# Patient Record
Sex: Female | Born: 1995 | Race: White | Hispanic: No | Marital: Single | Smoking: Current every day smoker
Health system: Southern US, Community
[De-identification: ages and names within clinical notes are randomized; demographics above are authoritative.]

## PROBLEM LIST (undated history)

## (undated) DIAGNOSIS — R87619 Unspecified abnormal cytological findings in specimens from cervix uteri: Secondary | ICD-10-CM

## (undated) DIAGNOSIS — A64 Unspecified sexually transmitted disease: Secondary | ICD-10-CM

## (undated) HISTORY — DX: Unspecified sexually transmitted disease: A64

## (undated) HISTORY — DX: Unspecified abnormal cytological findings in specimens from cervix uteri: R87.619

## (undated) HISTORY — PX: INTRAUTERINE DEVICE INSERTION: SHX323

---
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
## 2019-04-06 DIAGNOSIS — Z1159 Encounter for screening for other viral diseases: Secondary | ICD-10-CM | POA: Diagnosis not present

## 2019-05-14 DIAGNOSIS — Z20828 Contact with and (suspected) exposure to other viral communicable diseases: Secondary | ICD-10-CM | POA: Diagnosis not present

## 2019-07-04 DIAGNOSIS — B373 Candidiasis of vulva and vagina: Secondary | ICD-10-CM | POA: Diagnosis not present

## 2019-08-31 DIAGNOSIS — Z113 Encounter for screening for infections with a predominantly sexual mode of transmission: Secondary | ICD-10-CM | POA: Diagnosis not present

## 2019-08-31 DIAGNOSIS — Z30431 Encounter for routine checking of intrauterine contraceptive device: Secondary | ICD-10-CM | POA: Diagnosis not present

## 2019-08-31 DIAGNOSIS — Z01419 Encounter for gynecological examination (general) (routine) without abnormal findings: Secondary | ICD-10-CM | POA: Diagnosis not present

## 2020-02-07 ENCOUNTER — Encounter (INDEPENDENT_AMBULATORY_CARE_PROVIDER_SITE_OTHER): Payer: Self-pay

## 2020-02-07 ENCOUNTER — Encounter: Payer: Self-pay | Admitting: Adult Health Nurse Practitioner

## 2020-02-07 ENCOUNTER — Other Ambulatory Visit: Payer: Self-pay

## 2020-02-07 ENCOUNTER — Ambulatory Visit: Payer: BC Managed Care – PPO | Admitting: Adult Health Nurse Practitioner

## 2020-02-07 ENCOUNTER — Encounter: Payer: Self-pay | Admitting: Adult Health

## 2020-02-07 VITALS — BP 118/70 | HR 95 | Temp 96.8°F | Ht 66.75 in | Wt 212.4 lb

## 2020-02-07 DIAGNOSIS — Z6833 Body mass index (BMI) 33.0-33.9, adult: Secondary | ICD-10-CM

## 2020-02-07 DIAGNOSIS — E559 Vitamin D deficiency, unspecified: Secondary | ICD-10-CM | POA: Diagnosis not present

## 2020-02-07 DIAGNOSIS — Z7689 Persons encountering health services in other specified circumstances: Secondary | ICD-10-CM

## 2020-02-07 DIAGNOSIS — R41840 Attention and concentration deficit: Secondary | ICD-10-CM | POA: Diagnosis not present

## 2020-02-07 DIAGNOSIS — Z8619 Personal history of other infectious and parasitic diseases: Secondary | ICD-10-CM

## 2020-02-07 DIAGNOSIS — Z975 Presence of (intrauterine) contraceptive device: Secondary | ICD-10-CM

## 2020-02-07 NOTE — Progress Notes (Signed)
Establish Care  Assessment and Plan:  Diagnoses and all orders for this visit:  Encounter to establish care  IUD (intrauterine device) in place Discussed establishing with OB/GYN for on going care related to age  History of HPV infection Notable for future screenings  Attention and concentration deficit Discussed methods of management Will do ADD/ADHD questionnaire next OV  Vitamin D deficiency Continue supplementation to maintain goal of 60-100 Not taking any supplementation at this time  BMI 33.0-33.9 Discussed dietary and exercise modifications  Discussed med's effects and SE's. Screening labs and tests as requested with regular follow-up as recommended. Over 30 minutes of face to face interview exam, counseling, chart review, and complex, high level critical decision making was performed this visit.    HPI  24 y.o. female  presents to establish care.  She recently moved back here one year after finishing college.  She was being seen for her womens health in [place], [place].  She has paraguard IUD, placed in 2017.  And her last pap was 09/2019.  She reports HPV positive on previous pap but has since cleared.  She has not had a primary care  But utilized the school [facility] or Urgent care for any acute needs.  She reports she does take elderberry for immune support and a vitamin C supplement.  Reports she is not consistent with them.  Reports she knows she does not drink enough water.  She reports her job does not allow drinking while working and on breaks isn't enough.    Report she has though that she possibly has ADD.  She reports that she has trouble completing tasks.  She has to have people repeat things or misses part of instructions.  She has noticee lot of impatience.  Consider referral to psychology, she has not had this in the past.  Will request records from previous office,  release of records form provided.  Her blood pressure has been controlled at home, today  their BP is   She does not workout. She denies chest pain, shortness of breath, dizziness.   She is not on cholesterol medication and denies myalgias. Her cholesterol has not been checked related to her age.  She has not been working on diet and exercise for prevention of prediabetes, she is not on bASA, she is not on ACE/ARB and denies nausea, paresthesia of the feet, polydipsia, polyuria, visual disturbances, vomiting and weight loss. No previous A1c on file.  Current Medications:  No current outpatient medications on file prior to visit.   No current facility-administered medications on file prior to visit.    Allergies:  Not on File  Medical History:  She does not have a problem list on file.   Names of Other Physician/Practitioners you currently use: 1. [facility] here for primary care 2. Eye Exam:  Due for 2021 3. Dental Exam, every 6 months. Patient Care Team: Christy Cowboy, MD as PCP - General (Internal Medicine)  Health Maintenance:    There is no immunization history on file for this patient.   Preventative care: Last colonoscopy: N/A Last mammogram: N/A Last pap smear/pelvic exam: 2019, recommend establishing women's health related to ongoing birth control management. DEXA:N/A   Vaccinations:   Will check records for all vaccination.  Likely up to date related to college attendance, post graduate 2 years. TD or Tdap: ?  Influenza: Due for 2021  Pneumococcal: N/A Prevnar13: N/A Shingles/Zostavax: N/A HPV: Complete   Surgical History:  She  has no past surgical history on file.  Family History:  Her family history is not on file.  Social History:  She is single and just moved back to [place].  She does not drink ETOH and denies illicit drug use.    Review of Systems: Review of Systems  Constitutional: Negative for chills, diaphoresis, fever, malaise/fatigue and weight loss.  HENT: Negative for congestion,  ear discharge, ear pain, hearing loss, nosebleeds, sinus pain, sore throat and tinnitus.   Eyes: Negative for blurred vision, double vision, photophobia, pain, discharge and redness.  Respiratory: Negative for cough, hemoptysis, sputum production, shortness of breath, wheezing and stridor.   Cardiovascular: Negative for chest pain, palpitations, orthopnea, claudication, leg swelling and PND.  Gastrointestinal: Negative for abdominal pain, blood in stool, constipation, diarrhea, heartburn, melena, nausea and vomiting.  Genitourinary: Negative for dysuria, flank pain, frequency, hematuria and urgency.  Musculoskeletal: Negative for back pain, falls, joint pain, myalgias and neck pain.  Skin: Negative for itching and rash.  Neurological: Negative for dizziness, tingling, tremors, sensory change, speech change, focal weakness, seizures, loss of consciousness, weakness and headaches.  Endo/Heme/Allergies: Negative for environmental allergies and polydipsia. Does not bruise/bleed easily.  Psychiatric/Behavioral: Negative for depression, hallucinations, memory loss, substance abuse and suicidal ideas. The patient is not nervous/anxious and does not have insomnia.      Physical Exam: Vitals:   02/07/20 1504  BP: 118/70  Pulse: 95  Temp: (!) 96.8 F (36 C)  Height: 5' 6.75" (1.695 m)  Weight: 212 lb 6.4 oz (96.3 kg)  SpO2: 98%  BMI (Calculated): 33.53    General Appearance: Well nourished, in no apparent distress.  Eyes: PERRLA, EOMs, conjunctiva no swelling or erythema, normal fundi and vessels. Respiratory: Respiratory effort normal, BS equal bilaterally without rales, rhonchi, wheezing or stridor.  Cardio: RRR without murmurs, rubs or gallops. Brisk peripheral pulses without edema.   Abdomen: Soft, nontender, no guarding, rebound, hernias, masses, or organomegaly.  Lymphatics: Non tender without lymphadenopathy.  Musculoskeletal: Full ROM all peripheral extremities,5/5 strength, and normal  gait.  Skin: Warm, dry without rashes, lesions, ecchymosis. Neuro: Cranial nerves intact, reflexes equal bilaterally. Normal muscle tone, no cerebellar symptoms. Sensation intact.  Psych: Awake and oriented X 3, normal affect, Insight and Judgment appropriate.     Garnet Sierras, Laqueta Jean, DNP [facility] 02/14/2020  4:58 PM

## 2020-02-24 ENCOUNTER — Other Ambulatory Visit: Payer: Self-pay | Admitting: Adult Health Nurse Practitioner

## 2020-02-24 DIAGNOSIS — E782 Mixed hyperlipidemia: Secondary | ICD-10-CM

## 2020-03-10 NOTE — Progress Notes (Signed)
Assessment and Plan:  Lakelyn was seen today for follow-up.  Diagnoses and all orders for this visit:  Attention deficit hyperactivity disorder (ADHD), unspecified ADHD type Depression, unspecified depression type She does report significant 5/6 on ASRS today, however also PHQ-9 of 9 Possible ADD like sx secondary to depression; may benefit from wellbutrin Will also recommend lifestyle changes; encouraged more daily activity - good sleep hygiene discussed, increase day time activity, try melatonin or benadryl if this does not help we will call in sleep medication.  She is agreeable to 2-3 month trial; if inadequate benefit consider low dose adderall -     buPROPion (WELLBUTRIN XL) 150 MG 24 hr tablet; Take 1 tablet (150 mg total) by mouth every morning.  Further disposition pending results of labs. Discussed med's effects and SE's.   Over 30 minutes of exam, counseling, chart review, and critical decision making was performed.   Future Appointments  Date Time Provider Department Center  05/17/2020  8:45 AM Judd Gaudier, NP [facility] None  02/07/2021  3:00 PM Judd Gaudier, NP [facility] None    ------------------------------------------------------------------------------------------------------------------   HPI BP 122/70    Pulse 85    Temp (!) 96.8 F (36 C)    Ht 5' 6.75" (1.695 m)    Wt (!) 213 lb 3.2 oz (96.7 kg)    SpO2 98%    BMI 33.64 kg/m   24 y.o.female new to our office this year presents for evaluation for evaluation of difficulty with focus. She recently moved back here one year after finishing college and recently established with our office.   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 She reports she did do well in college, did tend to procrastinate but then stay up the night prior to tests and big projects, but did  get mostly A's and B's in college. Her brother has ADD, she always wondered if she did but her parents didn't want her to get tested, did better in school than he did.   She reports does have problems falling asleep, tends to be a night owl, falls asleep around 2 am, mind racing and trouble falling asleep, melatonin didn't help, admits uses phone in bed, gets 5-6 hours of sleep.   BMI is Body mass index is 33.64 kg/m., she admits very little exercise, works 11 am to 9 pm. Boyfriend will cook healthy, usually fish or chicken with veggie and baked potato, pasta, etc 1 venti cold brew daily in the morning, water only later in the day, ~40 fluid ounces  Reports limited alcohol, denies ETOH Wt Readings from Last 3 Encounters:  03/13/20 (!) 213 lb 3.2 oz (96.7 kg)  02/07/20 212 lb 6.4 oz (96.3 kg)   ASRS and PHQ-9 completed  No past medical history on file.   Allergies  Allergen Reactions   Amoxicillin Rash    No current outpatient medications on file prior to visit.   No current facility-administered medications on file prior to visit.    ROS: all negative except above.   Physical Exam:  BP 122/70    Pulse 85    Temp (!) 96.8 F (36 C)    Ht 5' 6.75" (1.695 m)    Wt (!) 213 lb 3.2 oz (96.7 kg)    SpO2 98%    BMI 33.64 kg/m   General Appearance: Well nourished, in no  apparent distress. Eyes: PERRLA, EOMs, conjunctiva no swelling or erythema Sinuses: No Frontal/maxillary tenderness ENT/Mouth: Ext aud canals clear, TMs without erythema, bulging. No erythema, swelling, or exudate on post pharynx.  Tonsils not swollen or erythematous. Hearing normal.  Neck: Supple, thyroid normal.  Respiratory: Respiratory effort normal, BS equal bilaterally without rales, rhonchi, wheezing or stridor.  Cardio: RRR with no MRGs. Brisk peripheral pulses without edema.  Abdomen: Soft, + BS.  Non tender, no guarding, rebound, hernias, masses. Lymphatics: Non tender without lymphadenopathy.   Musculoskeletal: Full ROM, 5/5 strength, normal gait.  Skin: Warm, dry without rashes, lesions, ecchymosis.  Neuro: Cranial nerves intact. Normal muscle tone, no cerebellar symptoms. Sensation intact.  Psych: Awake and oriented X 3, normal affect, Insight and Judgment appropriate.     Dan Maker, NP 12:48 PM [facility]

## 2020-03-13 ENCOUNTER — Ambulatory Visit: Payer: BC Managed Care – PPO | Admitting: Adult Health

## 2020-03-13 ENCOUNTER — Other Ambulatory Visit: Payer: Self-pay

## 2020-03-13 ENCOUNTER — Encounter: Payer: Self-pay | Admitting: Adult Health

## 2020-03-13 VITALS — BP 122/70 | HR 85 | Temp 96.8°F | Ht 66.75 in | Wt 213.2 lb

## 2020-03-13 DIAGNOSIS — F32A Depression, unspecified: Secondary | ICD-10-CM

## 2020-03-13 DIAGNOSIS — F909 Attention-deficit hyperactivity disorder, unspecified type: Secondary | ICD-10-CM

## 2020-03-13 DIAGNOSIS — F329 Major depressive disorder, single episode, unspecified: Secondary | ICD-10-CM | POA: Diagnosis not present

## 2020-03-13 MED ORDER — BUPROPION HCL ER (XL) 150 MG PO TB24: 150.0000 mg | ORAL_TABLET | ORAL | 2 refills | Status: DC

## 2020-03-13 NOTE — Patient Instructions (Addendum)
Bupropion extended-release tablets (Depression/Mood Disorders) What is this medicine? BUPROPION (byoo PROE pee on) is used to treat depression. This medicine may be used for other purposes; ask your health care provider or pharmacist if you have questions. COMMON BRAND NAME(S): Aplenzin, Budeprion XL, Forfivo XL, Wellbutrin XL What should I tell my health care provider before I take this medicine? They need to know if you have any of these conditions:  an eating disorder, such as anorexia or bulimia  bipolar disorder or psychosis  diabetes or high blood sugar, treated with medication  glaucoma  head injury or brain tumor  heart disease, previous heart attack, or irregular heart beat  high blood pressure  kidney or liver disease  seizures (convulsions)  suicidal thoughts or a previous suicide attempt  Tourette's syndrome  weight loss  an unusual or allergic reaction to bupropion, other medicines, foods, dyes, or preservatives  breast-feeding  pregnant or trying to become pregnant How should I use this medicine? Take this medicine by mouth with a glass of water. Follow the directions on the prescription label. You can take it with or without food. If it upsets your stomach, take it with food. Do not crush, chew, or cut these tablets. This medicine is taken once daily at the same time each day. Do not take your medicine more often than directed. Do not stop taking this medicine suddenly except upon the advice of your doctor. Stopping this medicine too quickly may cause serious side effects or your condition may worsen. A special MedGuide will be given to you by the pharmacist with each prescription and refill. Be sure to read this information carefully each time. Talk to your pediatrician regarding the use of this medicine in children. Special care may be needed. Overdosage: If you think you have taken too much of this medicine contact a poison control center or emergency room  at once. NOTE: This medicine is only for you. Do not share this medicine with others. What if I miss a dose? If you miss a dose, skip the missed dose and take your next tablet at the regular time. Do not take double or extra doses. What may interact with this medicine? Do not take this medicine with any of the following medications:  linezolid  MAOIs like Azilect, Carbex, Eldepryl, Marplan, Nardil, and Parnate  methylene blue (injected into a vein)  other medicines that contain bupropion like Zyban This medicine may also interact with the following medications:  alcohol  certain medicines for anxiety or sleep  certain medicines for blood pressure like metoprolol, propranolol  certain medicines for depression or psychotic disturbances  certain medicines for HIV or AIDS like efavirenz, lopinavir, nelfinavir, ritonavir  certain medicines for irregular heart beat like propafenone, flecainide  certain medicines for Parkinson's disease like amantadine, levodopa  certain medicines for seizures like carbamazepine, phenytoin, phenobarbital  cimetidine  clopidogrel  cyclophosphamide  digoxin  furazolidone  isoniazid  nicotine  orphenadrine  procarbazine  steroid medicines like prednisone or cortisone  stimulant medicines for attention disorders, weight loss, or to stay awake  tamoxifen  theophylline  thiotepa  ticlopidine  tramadol  warfarin This list may not describe all possible interactions. Give your health care provider a list of all the medicines, herbs, non-prescription drugs, or dietary supplements you use. Also tell them if you smoke, drink alcohol, or use illegal drugs. Some items may interact with your medicine. What should I watch for while using this medicine? Tell your doctor if your symptoms do   not get better or if they get worse. Visit your doctor or healthcare provider for regular checks on your progress. Because it may take several weeks to  see the full effects of this medicine, it is important to continue your treatment as prescribed by your doctor. This medicine may cause serious skin reactions. They can happen weeks to months after starting the medicine. Contact your healthcare provider right away if you notice fevers or flu-like symptoms with a rash. The rash may be red or purple and then turn into blisters or peeling of the skin. Or, you might notice a red rash with swelling of the face, lips or lymph nodes in your neck or under your arms. Patients and their families should watch out for new or worsening thoughts of suicide or depression. Also watch out for sudden changes in feelings such as feeling anxious, agitated, panicky, irritable, hostile, aggressive, impulsive, severely restless, overly excited and hyperactive, or not being able to sleep. If this happens, especially at the beginning of treatment or after a change in dose, call your healthcare provider. Avoid alcoholic drinks while taking this medicine. Drinking large amounts of alcoholic beverages, using sleeping or anxiety medicines, or quickly stopping the use of these agents while taking this medicine may increase your risk for a seizure. Do not drive or use heavy machinery until you know how this medicine affects you. This medicine can impair your ability to perform these tasks. Do not take this medicine close to bedtime. It may prevent you from sleeping. Your mouth may get dry. Chewing sugarless gum or sucking hard candy, and drinking plenty of water may help. Contact your doctor if the problem does not go away or is severe. The tablet shell for some brands of this medicine does not dissolve. This is normal. The tablet shell may appear whole in the stool. This is not a cause for concern. What side effects may I notice from receiving this medicine? Side effects that you should report to your doctor or health care professional as soon as possible:  allergic reactions like  skin rash, itching or hives, swelling of the face, lips, or tongue  breathing problems  changes in vision  confusion  elevated mood, decreased need for sleep, racing thoughts, impulsive behavior  fast or irregular heartbeat  hallucinations, loss of contact with reality  increased blood pressure  rash, fever, and swollen lymph nodes  redness, blistering, peeling or loosening of the skin, including inside the mouth  seizures  suicidal thoughts or other mood changes  unusually weak or tired  vomiting Side effects that usually do not require medical attention (report to your doctor or health care professional if they continue or are bothersome):  constipation  headache  loss of appetite  nausea  tremors  weight loss This list may not describe all possible side effects. Call your doctor for medical advice about side effects. You may report side effects to FDA at 1-800-FDA-1088. Where should I keep my medicine? Keep out of the reach of children. Store at room temperature between 15 and 30 degrees C (59 and 86 degrees F). Throw away any unused medicine after the expiration date. NOTE: This sheet is a summary. It may not cover all possible information. If you have questions about this medicine, talk to your doctor, pharmacist, or health care provider.  2020 Elsevier/Gold Standard (2018-10-29 13:45:31)     Attention Deficit Hyperactivity Disorder, Adult Attention deficit hyperactivity disorder (ADHD) is a mental health disorder that starts during childhood (  neurodevelopmental disorder). For many people with ADHD, the disorder continues into the adult years. Treatment can help you manage your symptoms. What are the causes? The exact cause of ADHD is not known. Most experts believe genetics and environmental factors contribute to ADHD. What increases the risk? The following factors may make you more likely to develop this condition:  Having a family history of  ADHD.  Being female.  Being born to a mother who smoked or drank alcohol during pregnancy.  Being exposed to lead or other toxins in the womb or early in life.  Being born before 37 weeks of pregnancy (prematurely) or at a low birth weight.  Having experienced a brain injury. What are the signs or symptoms? Symptoms of this condition depend on the type of ADHD. The two main types are inattentive and hyperactive-impulsive. Some people may have symptoms of both types. Symptoms of the inattentive type include:  Difficulty paying attention.  Making careless mistakes.  Not following instructions.  Being disorganized.  Avoiding tasks that require time and attention.  Losing and forgetting things.  Being easily distracted. Symptoms of the hyperactive-impulsive type include:  Restlessness.  Talking too much.  Interrupting.  Difficulty with: ? Sitting still. ? Feeling motivated. ? Relaxing. ? Waiting in line or waiting for a turn. In adults, this condition may lead to certain problems, such as:  Keeping jobs.  Performing tasks at work.  Having stable relationships.  Being on time or keeping to a schedule. How is this diagnosed? This condition is diagnosed based on your current symptoms and your history of symptoms. The diagnosis can be made by a health care provider such as a primary care provider or a mental health care specialist. Your health care provider may use a symptom checklist or a behavior rating scale to evaluate your symptoms. He or she may also want to talk with people who have observed your behaviors throughout your life. How is this treated? This condition can be treated with medicines and behavior therapy. Medicines may be the best option to reduce impulsive behaviors and improve attention. Your health care provider may recommend:  Stimulant medicines. These are the most common medicines used for adult ADHD. They affect certain chemicals in the brain  (neurotransmitters) and improve your ability to control your symptoms.  A non-stimulant medicine for adult ADHD (atomoxetine). This medicine increases a neurotransmitter called norepinephrine. It may take weeks to months to see effects from this medicine. Counseling and behavioral management are also important for treating ADHD. Counseling is often used along with medicine. Your health care provider may suggest:  Cognitive behavioral therapy (CBT). This type of therapy teaches you to replace negative thoughts and actions with positive thoughts and actions. When used as part of ADHD treatment, this therapy may also include: ? Coping strategies for organization, time management, impulse control, and stress reduction. ? Mindfulness and meditation training.  Behavioral management. You may work with a Psychologist, occupationalcoach who is specially trained to help people with ADHD manage and organize activities and function more effectively. Follow these instructions at home: Medicines   Take over-the-counter and prescription medicines only as told by your health care provider.  Talk with your health care provider about the possible side effects of your medicines and how to manage them. Lifestyle   Do not use drugs.  Do not drink alcohol if: ? Your health care provider tells you not to drink. ? You are pregnant, may be pregnant, or are planning to become pregnant.  If  you drink alcohol: ? Limit how much you use to:  0-1 drink a day for women.  0-2 drinks a day for men. ? Be aware of how much alcohol is in your drink. In the [place] one drink equals one 12 oz bottle of beer (355 mL), one 5 oz glass of wine (148 mL), or one 1 oz glass of hard liquor (44 mL).  Get enough sleep.  Eat a healthy diet.  Exercise regularly. Exercise can help to reduce stress and anxiety. General instructions  Learn as much as you can about adult ADHD, and work closely with your health care providers to find the treatments that  work best for you.  Follow the same schedule each day.  Use reminder devices like notes, calendars, and phone apps to stay on time and organized.  Keep all follow-up visits as told by your health care provider and therapist. This is important. Where to find more information A health care provider may be able to recommend resources that are available online or over the phone. You could start with:  Attention Deficit Disorder Association (ADDA): http://davis-dillon.net/  [employer] of Mental Health [facility]): http://www.maynard.net/ Contact a health care provider if:  Your symptoms continue to cause problems.  You have side effects from your medicine, such as: ? Repeated muscle twitches, coughing, or speech outbursts. ? Sleep problems. ? Loss of appetite. ? Dizziness. ? Unusually fast heartbeat. ? Stomach pains. ? Headaches.  You are struggling with anxiety, depression, or substance abuse. Get help right away if you:  Have a severe reaction to a medicine. If you ever feel like you may hurt yourself or others, or have thoughts about taking your own life, get help right away. You can go to the nearest emergency department or call:  Your local emergency services (911 in the [place]  A suicide crisis helpline, such as the National Suicide Prevention Lifeline at 585-699-9809. This is open 24 hours a day. Summary  ADHD is a mental health disorder that starts during childhood (neurodevelopmental disorder) and often continues into the adult years.  The exact cause of ADHD is not known. Most experts believe genetics and environmental factors contribute to ADHD.  There is no cure for ADHD, but treatment with medicine, cognitive behavioral therapy, or behavioral management can help you manage your condition. This information is not intended to replace advice given to you by your health care provider. Make sure you discuss any questions you have with your health care provider. Document Revised:  12/28/2018 Document Reviewed: 12/28/2018 Elsevier Patient Education  2020 Elsevier Inc.        Can try melatonin -15 mg at night for sleep, can also do benadryl 25-50mg  at night for sleep.  If this does not help we can try prescription medication.  Also here is some information about good sleep hygiene.   Insomnia Insomnia is frequent trouble falling and/or staying asleep. Insomnia can be a long term problem or a short term problem. Both are common. Insomnia can be a short term problem when the wakefulness is related to a certain stress or worry. Long term insomnia is often related to ongoing stress during waking hours and/or poor sleeping habits. Overtime, sleep deprivation itself can make the problem worse. Every little thing feels more severe because you are overtired and your ability to cope is decreased. CAUSES   Stress, anxiety, and depression.  Poor sleeping habits.  Distractions such as TV in the bedroom.  Naps close to bedtime.  Engaging in  emotionally charged conversations before bed.  Technical reading before sleep.  Alcohol and other sedatives. They may make the problem worse. They can hurt normal sleep patterns and normal dream activity.  Stimulants such as caffeine for several hours prior to bedtime.  Pain syndromes and shortness of breath can cause insomnia.  Exercise late at night.  Changing time zones may cause sleeping problems (jet lag). It is sometimes helpful to have someone observe your sleeping patterns. They should look for periods of not breathing during the night (sleep apnea). They should also look to see how long those periods last. If you live alone or observers are uncertain, you can also be observed at a sleep clinic where your sleep patterns will be professionally monitored. Sleep apnea requires a checkup and treatment. Give your caregivers your medical history. Give your caregivers observations your family has made about your sleep.   SYMPTOMS   Not feeling rested in the morning.  Anxiety and restlessness at bedtime.  Difficulty falling and staying asleep. TREATMENT   Your caregiver may prescribe treatment for an underlying medical disorders. Your caregiver can give advice or help if you are using alcohol or other drugs for self-medication. Treatment of underlying problems will usually eliminate insomnia problems.  Medications can be prescribed for short time use. They are generally not recommended for lengthy use.  Over-the-counter sleep medicines are not recommended for lengthy use. They can be habit forming.  You can promote easier sleeping by making lifestyle changes such as:  Using relaxation techniques that help with breathing and reduce muscle tension.  Exercising earlier in the day.  Changing your diet and the time of your last meal. No night time snacks.  Establish a regular time to go to bed.  Counseling can help with stressful problems and worry.  Soothing music and white noise may be helpful if there are background noises you cannot remove.  Stop tedious detailed work at least one hour before bedtime. HOME CARE INSTRUCTIONS   Keep a diary. Inform your caregiver about your progress. This includes any medication side effects. See your caregiver regularly. Take note of:  Times when you are asleep.  Times when you are awake during the night.  The quality of your sleep.  How you feel the next day. This information will help your caregiver care for you.  Get out of bed if you are still awake after 15 minutes. Read or do some quiet activity. Keep the lights down. Wait until you feel sleepy and go back to bed.  Keep regular sleeping and waking hours. Avoid naps.  Exercise regularly.  Avoid distractions at bedtime. Distractions include watching television or engaging in any intense or detailed activity like attempting to balance the household checkbook.  Develop a bedtime ritual. Keep a  familiar routine of bathing, brushing your teeth, climbing into bed at the same time each night, listening to soothing music. Routines increase the success of falling to sleep faster.  Use relaxation techniques. This can be using breathing and muscle tension release routines. It can also include visualizing peaceful scenes. You can also help control troubling or intruding thoughts by keeping your mind occupied with boring or repetitive thoughts like the old concept of counting sheep. You can make it more creative like imagining planting one beautiful flower after another in your backyard garden.  During your day, work to eliminate stress. When this is not possible use some of the previous suggestions to help reduce the anxiety that accompanies stressful situations. MAKE  SURE YOU:   Understand these instructions.  Will watch your condition.  Will get help right away if you are not doing well or get worse. Document Released: 08/02/2000 Document Revised: 10/28/2011 Document Reviewed: 09/02/2007 [facility] Patient Information 2015 [place], [place]. This information is not intended to replace advice given to you by your health care provider. Make sure you discuss any questions you have with your health care provider.

## 2020-05-15 DIAGNOSIS — E669 Obesity, unspecified: Secondary | ICD-10-CM | POA: Insufficient documentation

## 2020-05-15 NOTE — Progress Notes (Signed)
Assessment and Plan:  Christy Galloway was seen today for follow-up.  Diagnoses and all orders for this visit:  Attention deficit hyperactivity disorder (ADHD), unspecified ADHD type Depression, unspecified depression type She does report significant 5/6 on ASRS today, however also PHQ-9 of 9 No improvement with wellbutrin, stop Will proceed with trial of low dose adderall after discussion The patient was counseled on possible SE and the addictive nature of the medication and was encouraged to take drug holidays when not needed.  -     amphetamine-dextroamphetamine (ADDERALL) 10 MG tablet; Take 1/2-1 tab in the morning, then 1/2 tab in the afternoon if needed. Avoid taking daily, skip on days not working.  Obesity (BMI 30.0-34.9) Long discussion about weight loss, diet, and exercise Recommended diet heavy in fruits and veggies and low in animal meats, cheeses, and dairy products, appropriate calorie intake Discussed appropriate weight for height  Follow up at next visit  Fatigue, unspecified type -     CBC with Differential/Platelet -     COMPLETE METABOLIC PANEL WITH GFR -     TSH  Further disposition pending results of labs. Discussed med's effects and SE's.   Over 30 minutes of exam, counseling, chart review, and critical decision making was performed.   Future Appointments  Date Time Provider Department Center  02/07/2021  2:00 PM McClanahan, Bella Kennedy, NP [facility] None    ------------------------------------------------------------------------------------------------------------------   HPI BP 118/78   Pulse 69   Temp (!) 97.3 F (36.3 C)   Wt 221 lb 9.6 oz (100.5 kg)   SpO2 98%   BMI 34.97 kg/m   23 y.o.female new to our office this year presents for follow up on depression and focus. She recently moved back here one year after finishing college and recently established with our office.   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 She reports she did do well in college, did tend to procrastinate but then stay up the night prior to tests and big projects, but did get mostly A's and B's in college. Her brother has ADD, she always wondered if she did but her parents didn't want her to get tested, did better in school than he did. She scored 5/6 on ASRS last visit, but concern with possible sx r/t depression and did trial of wellbutrin. Reports very mild improvement first few days, but then actually felt worse having more down days.   She reports since last visit going to bed much more regularly at 11:30 pm    does have problems falling asleep, tends to be a night owl, falls asleep around 2 am, mind racing and trouble falling asleep, melatonin didn't help, admits uses phone in bed, gets 5-6 hours of sleep.   BMI is Body mass index is 34.97 kg/m., she admits very little exercise, works 11 am to 9 pm. Boyfriend will cook healthy, usually fish or chicken with veggie and baked potato, pasta, etc 1 venti cold brew daily in the morning, water only later in the day, ~80 fluid ounces  Reports limited alcohol, denies ETOH Wt Readings from Last 3 Encounters:  05/17/20 221 lb 9.6 oz (100.5 kg)  03/13/20 (!) 213 lb 3.2 oz (96.7 kg)  02/07/20 212 lb 6.4 oz (96.3 kg)    No past medical history on file.   Allergies  Allergen Reactions  . Amoxicillin Rash  Current Outpatient Medications on File Prior to Visit  Medication Sig  . buPROPion (WELLBUTRIN XL) 150 MG 24 hr tablet Take 1 tablet (150 mg total) by mouth every morning.   No current facility-administered medications on file prior to visit.    ROS: all negative except above.   Physical Exam:  BP 118/78   Pulse 69   Temp (!) 97.3 F (36.3 C)   Wt 221 lb 9.6 oz (100.5 kg)   SpO2 98%   BMI 34.97 kg/m   General Appearance: Well nourished, in no apparent distress. Eyes:  PERRLA, EOMs, conjunctiva no swelling or erythema Sinuses: No Frontal/maxillary tenderness ENT/Mouth: Ext aud canals clear, TMs without erythema, bulging. No erythema, swelling, or exudate on post pharynx.  Tonsils not swollen or erythematous. Hearing normal.  Neck: Supple, thyroid normal.  Respiratory: Respiratory effort normal, BS equal bilaterally without rales, rhonchi, wheezing or stridor.  Cardio: RRR with no MRGs. Brisk peripheral pulses without edema.  Abdomen: Soft, + BS.  Non tender, no guarding, rebound, hernias, masses. Lymphatics: Non tender without lymphadenopathy.  Musculoskeletal: Full ROM, 5/5 strength, normal gait.  Skin: Warm, dry without rashes, lesions, ecchymosis.  Neuro: Cranial nerves intact. Normal muscle tone, no cerebellar symptoms. Sensation intact.  Psych: Awake and oriented X 3, mildly depressed affect, Insight and Judgment appropriate.     Dan Maker, NP 8:47 AM [facility]

## 2020-05-17 ENCOUNTER — Ambulatory Visit: Payer: BC Managed Care – PPO | Admitting: Adult Health

## 2020-05-17 ENCOUNTER — Encounter: Payer: Self-pay | Admitting: Adult Health

## 2020-05-17 ENCOUNTER — Other Ambulatory Visit: Payer: Self-pay

## 2020-05-17 VITALS — BP 118/78 | HR 69 | Temp 97.3°F | Wt 221.6 lb

## 2020-05-17 DIAGNOSIS — F900 Attention-deficit hyperactivity disorder, predominantly inattentive type: Secondary | ICD-10-CM

## 2020-05-17 DIAGNOSIS — R5383 Other fatigue: Secondary | ICD-10-CM

## 2020-05-17 DIAGNOSIS — Z79899 Other long term (current) drug therapy: Secondary | ICD-10-CM

## 2020-05-17 DIAGNOSIS — E669 Obesity, unspecified: Secondary | ICD-10-CM | POA: Diagnosis not present

## 2020-05-17 DIAGNOSIS — F329 Major depressive disorder, single episode, unspecified: Secondary | ICD-10-CM

## 2020-05-17 DIAGNOSIS — F32A Depression, unspecified: Secondary | ICD-10-CM

## 2020-05-17 MED ORDER — AMPHETAMINE-DEXTROAMPHETAMINE 10 MG PO TABS: ORAL_TABLET | ORAL | 0 refills | Status: DC

## 2020-05-17 NOTE — Patient Instructions (Addendum)
Get on vitamin D supplement - 1000-2000 IU daily   B complex - vitamin      Amphetamine; Dextroamphetamine tablets What is this medicine? AMPHETAMINE; DEXTROAMPHETAMINE(am FET a meen; dex troe am FET a meen) is used to treat attention-deficit hyperactivity disorder (ADHD). It may also be used for narcolepsy. Federal law prohibits giving this medicine to any person other than the person for whom it was prescribed. Do not share this medicine with anyone else. This medicine may be used for other purposes; ask your health care provider or pharmacist if you have questions. COMMON BRAND NAME(S): Adderall What should I tell my health care provider before I take this medicine? They need to know if you have any of these conditions:  anxiety or panic attacks  circulation problems in fingers and toes  glaucoma  hardening or blockages of the arteries or heart blood vessels  heart disease or a heart defect  high blood pressure  history of a drug or alcohol abuse problem  history of stroke  kidney disease  liver disease  mental illness  seizures  suicidal thoughts, plans, or attempt; a previous suicide attempt by you or a family member  thyroid disease  Tourette's syndrome  an unusual or allergic reaction to dextroamphetamine, other amphetamines, other medicines, foods, dyes, or preservatives  pregnant or trying to get pregnant  breast-feeding How should I use this medicine? Take this medicine by mouth with a glass of water. Follow the directions on the prescription label. Take your doses at regular intervals. Do not take your medicine more often than directed. Do not suddenly stop your medicine. You must gradually reduce the dose or you may feel withdrawal effects. Ask your doctor or health care professional for advice. Talk to your pediatrician regarding the use of this medicine in children. Special care may be needed. While this drug may be prescribed for children as  young as 3 years for selected conditions, precautions do apply. Overdosage: If you think you have taken too much of this medicine contact a poison control center or emergency room at once. NOTE: This medicine is only for you. Do not share this medicine with others. What if I miss a dose? If you miss a dose, take it as soon as you can. If it is almost time for your next dose, take only that dose. Do not take double or extra doses. What may interact with this medicine? Do not take this medicine with any of the following medications:  MAOIs like Carbex, Eldepryl, Marplan, Nardil, and Parnate  other stimulant medicines for attention disorders This medicine may also interact with the following medications:  acetazolamide  ammonium chloride  antacids  ascorbic acid  atomoxetine  caffeine  certain medicines for blood pressure  certain medicines for depression, anxiety, or psychotic disturbances  certain medicines for seizures like carbamazepine, phenobarbital, phenytoin  certain medicines for stomach problems like cimetidine, ranitidine, famotidine, esomeprazole, omeprazole, lansoprazole, pantoprazole  lithium  medicines for colds and breathing difficulties  medicines for diabetes  medicines or dietary supplements for weight loss or to stay awake  methenamine  narcotic medicines for pain  quinidine  ritonavir  sodium bicarbonate  St. John's wort This list may not describe all possible interactions. Give your health care provider a list of all the medicines, herbs, non-prescription drugs, or dietary supplements you use. Also tell them if you smoke, drink alcohol, or use illegal drugs. Some items may interact with your medicine. What should I watch for while  using this medicine? Visit your doctor or health care professional for regular checks on your progress. This prescription requires that you follow special procedures with your doctor and pharmacy. You will need to  have a new written prescription from your doctor every time you need a refill. This medicine may affect your concentration, or hide signs of tiredness. Until you know how this medicine affects you, do not drive, ride a bicycle, use machinery, or do anything that needs mental alertness. Tell your doctor or health care professional if this medicine loses its effects, or if you feel you need to take more than the prescribed amount. Do not change the dosage without talking to your doctor or health care professional. Decreased appetite is a common side effect when starting this medicine. Eating Bader, frequent meals or snacks can help. Talk to your doctor if you continue to have poor eating habits. Height and weight growth of a child taking this medicine will be monitored closely. Do not take this medicine close to bedtime. It may prevent you from sleeping. If you are going to need surgery, a MRI, CT scan, or other procedure, tell your doctor that you are taking this medicine. You may need to stop taking this medicine before the procedure. Tell your doctor or healthcare professional right away if you notice unexplained wounds on your fingers and toes while taking this medicine. You should also tell your healthcare provider if you experience numbness or pain, changes in the skin color, or sensitivity to temperature in your fingers or toes. What side effects may I notice from receiving this medicine? Side effects that you should report to your doctor or health care professional as soon as possible:  allergic reactions like skin rash, itching or hives, swelling of the face, lips, or tongue  anxious  breathing problems  changes in emotions or moods  changes in vision  chest pain or chest tightness  fast, irregular heartbeat  fingers or toes feel numb, cool, painful  hallucination, loss of contact with reality  high blood pressure  males: prolonged or painful erection  seizures  signs and  symptoms of serotonin syndrome like confusion, increased sweating, fever, tremor, stiff muscles, diarrhea  signs and symptoms of a stroke like changes in vision; confusion; trouble speaking or understanding; severe headaches; sudden numbness or weakness of the face, arm or leg; trouble walking; dizziness; loss of balance or coordination  suicidal thoughts or other mood changes  uncontrollable head, mouth, neck, arm, or leg movements Side effects that usually do not require medical attention (report to your doctor or health care professional if they continue or are bothersome):  dry mouth  headache  irritability  loss of appetite  nausea  trouble sleeping  weight loss This list may not describe all possible side effects. Call your doctor for medical advice about side effects. You may report side effects to FDA at 1-800-FDA-1088. Where should I keep my medicine? Keep out of the reach of children. This medicine can be abused. Keep your medicine in a safe place to protect it from theft. Do not share this medicine with anyone. Selling or giving away this medicine is dangerous and against the law. Store at room temperature between 15 and 30 degrees C (59 and 86 degrees F). Keep container tightly closed. Throw away any unused medicine after the expiration date. Dispose of properly. This medicine may cause accidental overdose and death if it is taken by other adults, children, or pets. Mix any unused medicine with  a substance like cat litter or coffee grounds. Then throw the medicine away in a sealed container like a sealed bag or a coffee can with a lid. Do not use the medicine after the expiration date. NOTE: This sheet is a summary. It may not cover all possible information. If you have questions about this medicine, talk to your doctor, pharmacist, or health care provider.  2020 Elsevier/Gold Standard (2016-09-27 38:33:38)

## 2020-05-18 LAB — COMPLETE METABOLIC PANEL WITH GFR
AG Ratio: 2 (calc) (ref 1.0–2.5)
ALT: 19 U/L (ref 6–29)
AST: 17 U/L (ref 10–30)
Albumin: 4.7 g/dL (ref 3.6–5.1)
Alkaline phosphatase (APISO): 45 U/L (ref 31–125)
BUN: 10 mg/dL (ref 7–25)
CO2: 27 mmol/L (ref 20–32)
Calcium: 9.4 mg/dL (ref 8.6–10.2)
Chloride: 103 mmol/L (ref 98–110)
Creat: 0.83 mg/dL (ref 0.50–1.10)
GFR, Est African American: 114 mL/min/{1.73_m2} (ref >=60)
GFR, Est Non African American: 99 mL/min/{1.73_m2} (ref >=60)
Globulin: 2.4 g/dL (calc) (ref 1.9–3.7)
Glucose, Bld: 89 mg/dL (ref 65–99)
Potassium: 4.2 mmol/L (ref 3.5–5.3)
Sodium: 139 mmol/L (ref 135–146)
Total Bilirubin: 0.4 mg/dL (ref 0.2–1.2)
Total Protein: 7.1 g/dL (ref 6.1–8.1)

## 2020-05-18 LAB — CBC WITH DIFFERENTIAL/PLATELET
Absolute Monocytes: 490 cells/uL (ref 200–950)
Basophils Absolute: 19 cells/uL (ref 0–200)
Basophils Relative: 0.4 %
Eosinophils Absolute: 91 cells/uL (ref 15–500)
Eosinophils Relative: 1.9 %
HCT: 39.9 % (ref 35.0–45.0)
Hemoglobin: 12.6 g/dL (ref 11.7–15.5)
Lymphs Abs: 1474 cells/uL (ref 850–3900)
MCH: 26.6 pg — ABNORMAL LOW (ref 27.0–33.0)
MCHC: 31.6 g/dL — ABNORMAL LOW (ref 32.0–36.0)
MCV: 84.4 fL (ref 80.0–100.0)
MPV: 10.2 fL (ref 7.5–12.5)
Monocytes Relative: 10.2 %
Neutro Abs: 2726 cells/uL (ref 1500–7800)
Neutrophils Relative %: 56.8 %
Platelets: 284 10*3/uL (ref 140–400)
RBC: 4.73 10*6/uL (ref 3.80–5.10)
RDW: 13.2 % (ref 11.0–15.0)
Total Lymphocyte: 30.7 %
WBC: 4.8 10*3/uL (ref 3.8–10.8)

## 2020-05-18 LAB — TSH: TSH: 1.2 mIU/L

## 2020-07-03 NOTE — Progress Notes (Signed)
Assessment and Plan:  Lesette was seen today for follow-up.  Diagnoses and all orders for this visit:  Attention deficit hyperactivity disorder (ADHD), unspecified ADHD type Continue current medications Helps with focus, no AE's. The patient was counseled on the addictive nature of the medication and was encouraged to take drug holidays when not needed.  -     amphetamine-dextroamphetamine (ADDERALL) 10 MG tablet; Take 1/2-1 tab in the morning, then 1/2 tab in the afternoon if needed. Avoid taking daily, skip on days not working.  Obesity (BMI 30.0-34.9) Long discussion about weight loss, diet, and exercise Recommended diet heavy in fruits and veggies and low in animal meats, cheeses, and dairy products, appropriate calorie intake Discussed appropriate weight for height  Follow up at next visit  Further disposition pending results of labs. Discussed med's effects and SE's.   Over 30 minutes of exam, counseling, chart review, and critical decision making was performed.   Future Appointments  Date Time Provider Department Center  02/07/2021  2:00 PM Elder Negus, NP [facility] None    ------------------------------------------------------------------------------------------------------------------   HPI BP 112/74   Pulse 71   Temp (!) 97.5 F (36.4 C)   Wt 218 lb (98.9 kg)   SpO2 96%   BMI 34.40 kg/m   24 y.o.female new to our office this year presents for follow up on ADD. She recently moved back here one year after finishing college and recently established with our office.   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 She reports she did do well in college, did tend to procrastinate but then stay up the night prior to tests and big projects, but did get mostly A's and B's in college. Her brother has ADD, she always wondered if  she did but her parents didn't want her to get tested, did better in school than he did. She scored high on ASRS last visit, but concern with possible sx r/t depression and did trial of wellbutrin. Reports very mild improvement first few days, but then actually felt worse having more down days. We discussed an initiated low dose adderall.   She reports significant improvement since starting low dose adderall - she reports has been taking 10 mg around 30 min prior to starting her work shift, doesn't take on days she is off, very rarely will take an additional 1/2 tab later during shift. She denies any SE of palpitations, anxiety, insomnia. Has noted general improvement in mood and motivation since starting medication, being more active on her days off even when not taking med.   She reports since last visit going to bed much more regularly at 10:30 pm and notes improved restorative sleep.   BMI is Body mass index is 34.4 kg/m., she admits very little exercise, works 11 am to 9 pm. Boyfriend will cook healthy, usually fish or chicken with veggie and baked potato, pasta, etc 1 venti cold brew daily in the morning, water only later in the day, ~80 fluid ounces  Reports limited alcohol, denies ETOH Wt Readings from Last 3 Encounters:  07/05/20 218 lb (98.9 kg)  05/17/20 221 lb 9.6 oz (100.5 kg)  03/13/20 (!) 213 lb 3.2 oz (96.7 kg)    No past medical history on file.   Allergies  Allergen Reactions  . Amoxicillin Rash    No current outpatient medications on file prior to  visit.   No current facility-administered medications on file prior to visit.    ROS: all negative except above.   Physical Exam:  BP 112/74   Pulse 71   Temp (!) 97.5 F (36.4 C)   Wt 218 lb (98.9 kg)   SpO2 96%   BMI 34.40 kg/m   General Appearance: Well nourished, in no apparent distress. Eyes: PERRLA, EOMs, conjunctiva no swelling or erythema Sinuses: No Frontal/maxillary tenderness ENT/Mouth: Ext aud  canals clear, TMs without erythema, bulging. No erythema, swelling, or exudate on post pharynx.  Tonsils not swollen or erythematous. Hearing normal.  Neck: Supple, thyroid normal.  Respiratory: Respiratory effort normal, BS equal bilaterally without rales, rhonchi, wheezing or stridor.  Cardio: RRR with no MRGs. Brisk peripheral pulses without edema.  Abdomen: Soft, + BS.  Non tender, no guarding, rebound, hernias, masses. Lymphatics: Non tender without lymphadenopathy.  Musculoskeletal: Full ROM, 5/5 strength, normal gait.  Skin: Warm, dry without rashes, lesions, ecchymosis.  Neuro: Cranial nerves intact. Normal muscle tone, no cerebellar symptoms. Sensation intact.  Psych: Awake and oriented X 3, mildly depressed affect, Insight and Judgment appropriate.     Dan Maker, NP 8:59 AM [facility]

## 2020-07-05 ENCOUNTER — Ambulatory Visit: Payer: BC Managed Care – PPO | Admitting: Adult Health

## 2020-07-05 ENCOUNTER — Other Ambulatory Visit: Payer: Self-pay

## 2020-07-05 ENCOUNTER — Encounter: Payer: Self-pay | Admitting: Adult Health

## 2020-07-05 VITALS — BP 112/74 | HR 71 | Temp 97.5°F | Wt 218.0 lb

## 2020-07-05 DIAGNOSIS — E669 Obesity, unspecified: Secondary | ICD-10-CM

## 2020-07-05 DIAGNOSIS — F32A Depression, unspecified: Secondary | ICD-10-CM

## 2020-07-05 DIAGNOSIS — F900 Attention-deficit hyperactivity disorder, predominantly inattentive type: Secondary | ICD-10-CM | POA: Diagnosis not present

## 2020-07-05 MED ORDER — AMPHETAMINE-DEXTROAMPHETAMINE 10 MG PO TABS: ORAL_TABLET | ORAL | 0 refills | Status: DC

## 2020-07-12 ENCOUNTER — Other Ambulatory Visit: Payer: Self-pay | Admitting: Adult Health

## 2020-07-12 MED ORDER — AMPHETAMINE-DEXTROAMPHETAMINE 10 MG PO TABS: ORAL_TABLET | ORAL | 0 refills | Status: DC

## 2020-07-12 NOTE — Progress Notes (Signed)
Future Appointments  Date Time Provider Department Center  02/07/2021  2:00 PM Elder Negus, NP [facility] None   PDMP checked for adderall refill request.

## 2020-09-09 ENCOUNTER — Other Ambulatory Visit: Payer: Self-pay

## 2020-09-09 MED ORDER — AMPHETAMINE-DEXTROAMPHETAMINE 10 MG PO TABS: ORAL_TABLET | ORAL | 0 refills | Status: DC

## 2020-10-02 ENCOUNTER — Other Ambulatory Visit (HOSPITAL_COMMUNITY)
Admission: RE | Admit: 2020-10-02 | Discharge: 2020-10-02 | Disposition: A | Discharge status: Home / Self Care | Payer: 59 | Source: Ambulatory Visit | Attending: Nurse Practitioner | Admitting: Nurse Practitioner

## 2020-10-02 ENCOUNTER — Encounter: Payer: Self-pay | Admitting: Nurse Practitioner

## 2020-10-02 ENCOUNTER — Other Ambulatory Visit: Payer: Self-pay

## 2020-10-02 ENCOUNTER — Ambulatory Visit: Payer: 59 | Admitting: Nurse Practitioner

## 2020-10-02 VITALS — BP 110/64 | HR 68 | Resp 16 | Ht 67.5 in | Wt 214.0 lb

## 2020-10-02 DIAGNOSIS — Z01419 Encounter for gynecological examination (general) (routine) without abnormal findings: Secondary | ICD-10-CM | POA: Diagnosis not present

## 2020-10-02 DIAGNOSIS — Z30431 Encounter for routine checking of intrauterine contraceptive device: Secondary | ICD-10-CM

## 2020-10-02 DIAGNOSIS — N921 Excessive and frequent menstruation with irregular cycle: Secondary | ICD-10-CM | POA: Diagnosis not present

## 2020-10-02 DIAGNOSIS — Z113 Encounter for screening for infections with a predominantly sexual mode of transmission: Secondary | ICD-10-CM | POA: Diagnosis not present

## 2020-10-02 LAB — CBC
HCT: 39 % (ref 35.0–45.0)
Hemoglobin: 12.6 g/dL (ref 11.7–15.5)
MCH: 26.7 pg — ABNORMAL LOW (ref 27.0–33.0)
MCHC: 32.3 g/dL (ref 32.0–36.0)
MCV: 82.6 fL (ref 80.0–100.0)
MPV: 10.4 fL (ref 7.5–12.5)
Platelets: 298 10*3/uL (ref 140–400)
RBC: 4.72 10*6/uL (ref 3.80–5.10)
RDW: 13.1 % (ref 11.0–15.0)
WBC: 6.3 10*3/uL (ref 3.8–10.8)

## 2020-10-02 MED ORDER — MEGESTROL ACETATE 40 MG PO TABS: 40.0000 mg | ORAL_TABLET | Freq: Two times a day (BID) | ORAL | 1 refills | Status: DC

## 2020-10-02 NOTE — Progress Notes (Signed)
25 y.o. G0P0000 Single White or Caucasian female here for annual exam.    New patient, establishing care. Previous patient in [place], [place] Paragard inserted 2018 She mostly has regular periods but skipped December period, started bleeding January 20, still bleeding. Describes it as a steady flow. Uses about 3 tampons and 2 pads daily. Reports dull cramping (usual for her).   Sexually active with same partner x 3 years, no concern for infection.  Last year pap was abnormal, was told just to follow up 1 year.  In the past was on OCP and did not like how she felt, so wanted to try non-hormonal method.   Works full time for [employer]. On feet most of the day, does not regularly exercise.    No LMP recorded. (Menstrual status: IUD).          Sexually active: Yes.    The current method of family planning is IUD.   paraguard inserted 09/2016 Exercising: No.  exercise Smoker:  no  Health Maintenance: Pap:  73yr ago History of abnormal Pap:  yes MMG:  none Colonoscopy:  none BMD:   none TDaP:  UTD Gardasil:   completed Covid-19: moderna Hep C testing: neg per patient Screening Labs: CBC, TSH and CMP done 04/2020   reports that she has been smoking e-cigarettes. She has never used smokeless tobacco. She reports previous alcohol use. She reports that she does not use drugs.  Past Medical History:  Diagnosis Date  . Abnormal Pap smear of cervix    HPV  . STD (sexually transmitted disease)    HPV    Past Surgical History:  Procedure Laterality Date  . INTRAUTERINE DEVICE INSERTION     paraguard inserted 09/2016    Current Outpatient Medications  Medication Sig Dispense Refill  . amphetamine-dextroamphetamine (ADDERALL) 10 MG tablet Take 1/2-1 tab in the morning, then 1/2 tab in the afternoon if needed. Avoid taking daily, skip on days not working. 45 tablet 0   No current facility-administered medications for this visit.    Family History  Problem Relation Age of Onset  .  Diabetes Father   . Hypertension Father     Review of Systems  Constitutional: Negative.   HENT: Negative.   Eyes: Negative.   Respiratory: Negative.   Cardiovascular: Negative.   Gastrointestinal: Negative.   Endocrine: Negative.        Reports she has hair growth in various areas, shaves chin every other day  Genitourinary:       Bleeding for with iud  Musculoskeletal: Negative.   Skin: Negative.   Allergic/Immunologic: Negative.   Neurological: Negative.   Hematological: Negative.   Psychiatric/Behavioral: Negative.     Exam:   BP 110/64   Pulse 68   Resp 16   Ht 5' 7.5" (1.715 m)   Wt 214 lb (97.1 kg)   BMI 33.02 kg/m   Height: 5' 7.5" (171.5 cm)  General appearance: alert, cooperative and appears stated age, no acute distress Head: Normocephalic, without obvious abnormality Neck: no adenopathy, thyroid normal to inspection and palpation Lungs: clear to auscultation bilaterally Breasts: normal appearance, no masses or tenderness, No axillary or supraclavicular adenopathy, Normal to palpation without dominant masses Heart: regular rate and rhythm Abdomen: soft, non-tender; no masses,  no organomegaly Extremities: extremities normal, no edema Skin: No rashes or lesions Lymph nodes: Cervical, supraclavicular, and axillary nodes normal. No abnormal inguinal nodes palpated Neurologic: Grossly normal   Pelvic: External genitalia:  no lesions  Urethra:  normal appearing urethra with no masses, tenderness or lesions              Bartholins and Skenes: normal                 Vagina: normal appearing vagina, appropriate for age, moderate amount blood, Pall clots, no lesions              Cervix: neg cervical motion tenderness, no visible lesions, Paragard strings visible 1.5 cm in length             Bimanual Exam:   Uterus:  normal size, contour, position, consistency, mobility, non-tender              Adnexa: no mass, fullness, tenderness                  Joy, CMA Chaperone was present for exam.  A:  Well woman exam with routine gynecological exam - Plan: Cytology - PAP( [place])  Surveillance of previously prescribed intrauterine contraceptive device  Menorrhagia with irregular cycle - Plan: CBC, megestrol (MEGACE) 40 MG tablet, US PELVIS TRANSVAGINAL NON-OB (TV ONLY)  Screening for STDs (sexually transmitted diseases)   P:   Pap :collected today, GC/CT  Labs:CBC  Medications: Megace: advised 1-2 today until bleeding light, then continue taking 1 po daily x 30 days.  Considering changing Paragard for different IUD (Kyleena vs Mirena), Would like Pelvic US. Discussed with hair growth, irregular menses, and difficulty losing weight, highly suggestive of Polycystic Ovarian Syndrome. Discussed diet and exercise.

## 2020-10-02 NOTE — Patient Instructions (Addendum)
Menorrhagia Menorrhagia is a form of abnormal uterine bleeding in which menstrual periods are heavy or last longer than normal. With menorrhagia, the periods may cause enough blood loss and cramping that a woman becomes unable to take part in her usual activities. What are the causes? Common causes of this condition include:  Polyps or fibroids. These are noncancerous growths in the uterus.  An imbalance of the hormones estrogen and progesterone.  Anovulation, which occurs when one of the ovaries does not release an egg during one or more months.  A problem with the thyroid gland (hypothyroidism).  Side effects of having an intrauterine device (IUD).  Side effects of some medicines, such as NSAIDs or blood thinners.  A bleeding disorder that stops the blood from clotting normally. In some cases, the cause of this condition is not known. What increases the risk? You are more likely to develop this condition if you have cancer of the uterus. What are the signs or symptoms? Symptoms of this condition include:  Routinely having to change your pad or tampon every 1-2 hours because it is soaked.  Needing to use pads and tampons at the same time because of heavy bleeding.  Needing to wake up to change your pads or tampons during the night.  Passing blood clots larger than 1 inch (2.5 cm) in size.  Having bleeding that lasts for more than 7 days.  Having symptoms of low iron levels (anemia), such as tiredness (fatigue) or shortness of breath. How is this diagnosed? This condition may be diagnosed based on:  A physical exam.  Your symptoms and menstrual history.  Tests, such as: ? Blood tests to check if you are pregnant or if you have hormonal changes, a bleeding or thyroid disorder, anemia, or other problems. ? Pap test to check for cancerous changes, infections, or inflammation. ? Endometrial biopsy. This test involves removing a tissue sample from the lining of the uterus  (endometrium) to be examined under a microscope. ? Pelvic ultrasound. This test uses sound waves to create images of your uterus, ovaries, and vagina. The images can show if you have fibroids or other growths. ? Hysteroscopy. For this test, a thin, flexible tube with a light on the end (hysteroscope) is used to look inside your uterus. How is this treated? Treatment may not be needed for this condition. If it is needed, the best treatment for you will depend on:  Whether you need to prevent pregnancy.  Your desire to have children in the future.  The cause and severity of your bleeding.  Your personal preference. Medicine Medicines are the first step in treatment. You may be treated with:  Hormonal birth control methods. These treatments reduce bleeding during your menstrual period. They include: ? Birth control pills. ? Skin patch. ? Vaginal ring. ? Shots (injections) that you get every 3 months. ? Hormonal IUD. ? Implants that go under the skin.  Medicines that thicken the blood and slow bleeding.  Medicines that reduce swelling, such as ibuprofen.  Medicines that contain an artificial (synthetic) hormone called progestin.  Medicines that make the ovaries stop working for a short time.  Iron supplements to treat anemia.   Surgery If medicines do not work, surgery may be done. Surgical options may include:  Dilation and curettage (D&C). In this procedure, your health care provider opens the lowest part of the uterus (cervix) and then scrapes or suctions tissue from the endometrium. This reduces menstrual bleeding.  Operative hysteroscopy. In this procedure,   a hysteroscope is used to view your uterus and help remove polyps that may be causing heavy periods.  Endometrial ablation. This is when various techniques are used to permanently destroy your entire endometrium. After endometrial ablation, most women have little or no menstrual flow. This procedure reduces your ability to  become pregnant.  Endometrial resection. In this procedure, an electrosurgical wire loop is used to remove the endometrium. This procedure reduces your ability to become pregnant.  Hysterectomy. This is surgical removal of your uterus. This is a permanent procedure that stops menstrual periods. Pregnancy is not possible after a hysterectomy. Follow these instructions at home: Medicines  Take over-the-counter and prescription medicines only as told by your health care provider. This includes iron pills.  Do not change or switch medicines without asking your health care provider.  Do not take aspirin or medicines that contain aspirin 1 week before or during your menstrual period. Aspirin may make bleeding worse. Managing constipation Your iron pills may cause constipation. If you are taking prescription iron supplements, you may need to take these actions to prevent or treat constipation:  Drink enough fluid to keep your urine pale yellow.  Take over-the-counter or prescription medicines.  Eat foods that are high in fiber, such as beans, whole grains, and fresh fruits and vegetables.  Limit foods that are high in fat and processed sugars, such as fried or sweet foods. General instructions  If you need to change your sanitary pad or tampon more than once every 2 hours, limit your activity until the bleeding stops.  Eat well-balanced meals, including foods that are high in iron. Foods that have a lot of iron include leafy green vegetables, meat, liver, eggs, and whole-grain breads and cereals.  Do not try to lose weight until the abnormal bleeding has stopped and your blood iron level is back to normal. If you need to lose weight, work with your health care provider to lose weight safely.  Keep all follow-up visits. This is important. Contact a health care provider if:  You soak through a pad or tampon every 1 or 2 hours, and this happens every time you have a period.  You need to use  pads and tampons at the same time because you are bleeding so much.  You have nausea, vomiting, diarrhea, or other problems related to medicines you are taking. Get help right away if:  You soak through more than a pad or tampon in 1 hour.  You pass clots bigger than 1 inch (2.5 cm) wide.  You feel short of breath.  You feel like your heart is beating too fast.  You feel dizzy or you faint.  You feel very weak or tired. Summary  Menorrhagia is a form of abnormal uterine bleeding in which menstrual periods are heavy or last longer than normal.  Treatment may not be needed for this condition. If it is needed, it may include medicines or procedures.  Take over-the-counter and prescription medicines only as told by your health care provider. This includes iron pills.  Get help right away if you have heavy bleeding that soaks through more than a pad or tampon in 1 hour, you pass large clots, or you feel dizzy, short of breath, or very weak or tired. This information is not intended to replace advice given to you by your health care provider. Make sure you discuss any questions you have with your health care provider. Document Revised: 04/18/2020 Document Reviewed: 04/18/2020 Elsevier Patient Education  2021   [employer]  Health Maintenance, Female Adopting a healthy lifestyle and getting preventive care are important in promoting health and wellness. Ask your health care provider about:  The right schedule for you to have regular tests and exams.  Things you can do on your own to prevent diseases and keep yourself healthy. What should I know about diet, weight, and exercise? Eat a healthy diet  Eat a diet that includes plenty of vegetables, fruits, low-fat dairy products, and lean protein.  Do not eat a lot of foods that are high in solid fats, added sugars, or sodium.   Maintain a healthy weight Body mass index (BMI) is used to identify weight problems. It estimates body fat  based on height and weight. Your health care provider can help determine your BMI and help you achieve or maintain a healthy weight. Get regular exercise Get regular exercise. This is one of the most important things you can do for your health. Most adults should:  Exercise for at least 150 minutes each week. The exercise should increase your heart rate and make you sweat (moderate-intensity exercise).  Do strengthening exercises at least twice a week. This is in addition to the moderate-intensity exercise.  Spend less time sitting. Even light physical activity can be beneficial. Watch cholesterol and blood lipids Have your blood tested for lipids and cholesterol at 26 years of age, then have this test every 5 years. Have your cholesterol levels checked more often if:  Your lipid or cholesterol levels are high.  You are older than 26 years of age.  You are at high risk for heart disease. What should I know about cancer screening? Depending on your health history and family history, you may need to have cancer screening at various ages. This may include screening for:  Breast cancer.  Cervical cancer.  Colorectal cancer.  Skin cancer.  Lung cancer. What should I know about heart disease, diabetes, and high blood pressure? Blood pressure and heart disease  High blood pressure causes heart disease and increases the risk of stroke. This is more likely to develop in people who have high blood pressure readings, are of African descent, or are overweight.  Have your blood pressure checked: ? Every 3-5 years if you are 35-30 years of age. ? Every year if you are 48 years old or older. Diabetes Have regular diabetes screenings. This checks your fasting blood sugar level. Have the screening done:  Once every three years after age 74 if you are at a normal weight and have a low risk for diabetes.  More often and at a younger age if you are overweight or have a high risk for  diabetes. What should I know about preventing infection? Hepatitis B If you have a higher risk for hepatitis B, you should be screened for this virus. Talk with your health care provider to find out if you are at risk for hepatitis B infection. Hepatitis C Testing is recommended for:  Everyone born from [birth year] through 1966.  Anyone with known risk factors for hepatitis C. Sexually transmitted infections (STIs)  Get screened for STIs, including gonorrhea and chlamydia, if: ? You are sexually active and are younger than 26 years of age. ? You are older than 26 years of age and your health care provider tells you that you are at risk for this type of infection. ? Your sexual activity has changed since you were last screened, and you are at increased risk for chlamydia or gonorrhea. Ask  your health care provider if you are at risk.  Ask your health care provider about whether you are at high risk for HIV. Your health care provider may recommend a prescription medicine to help prevent HIV infection. If you choose to take medicine to prevent HIV, you should first get tested for HIV. You should then be tested every 3 months for as long as you are taking the medicine. Pregnancy  If you are about to stop having your period (premenopausal) and you may become pregnant, seek counseling before you get pregnant.  Take 400 to 800 micrograms (mcg) of folic acid every day if you become pregnant.  Ask for birth control (contraception) if you want to prevent pregnancy. Osteoporosis and menopause Osteoporosis is a disease in which the bones lose minerals and strength with aging. This can result in bone fractures. If you are 408 years old or older, or if you are at risk for osteoporosis and fractures, ask your health care provider if you should:  Be screened for bone loss.  Take a calcium or vitamin D supplement to lower your risk of fractures.  Be given hormone replacement therapy (HRT) to treat symptoms of  menopause. Follow these instructions at home: Lifestyle  Do not use any products that contain nicotine or tobacco, such as cigarettes, e-cigarettes, and chewing tobacco. If you need help quitting, ask your health care provider.  Do not use street drugs.  Do not share needles.  Ask your health care provider for help if you need support or information about quitting drugs. Alcohol use  Do not drink alcohol if: ? Your health care provider tells you not to drink. ? You are pregnant, may be pregnant, or are planning to become pregnant.  If you drink alcohol: ? Limit how much you use to 0-1 drink a day. ? Limit intake if you are breastfeeding.  Be aware of how much alcohol is in your drink. In the [place] one drink equals one 12 oz bottle of beer (355 mL), one 5 oz glass of wine (148 mL), or one 1 oz glass of hard liquor (44 mL). General instructions  Schedule regular health, dental, and eye exams.  Stay current with your vaccines.  Tell your health care provider if: ? You often feel depressed. ? You have ever been abused or do not feel safe at home. Summary  Adopting a healthy lifestyle and getting preventive care are important in promoting health and wellness.  Follow your health care provider's instructions about healthy diet, exercising, and getting tested or screened for diseases.  Follow your health care provider's instructions on monitoring your cholesterol and blood pressure. This information is not intended to replace advice given to you by your health care provider. Make sure you discuss any questions you have with your health care provider. Document Revised: 07/29/2018 Document Reviewed: 07/29/2018 Elsevier Patient Education  2021 Elsevier Inc.   Diet for Polycystic Ovary Syndrome Polycystic ovary syndrome (PCOS) is a common hormonal disorder that affects a woman's reproductive system. It can cause problems with menstrual periods and make it hard to get and stay  pregnant. Changing what you eat can help your hormones reach normal levels, improve your health, and help you better manage PCOS. Following a balanced diet can help you lose weight and improve the way that your body uses the hormone insulin to control blood sugar. This may include:  Eating low-fat (lean) proteins, complex carbohydrates, fresh fruits and vegetables, low-fat dairy products, healthy fats, and fiber.  Cutting down on calories.  Exercising regularly. What are tips for following this plan?  Follow a balanced diet for meals and snacks. Eat breakfast, lunch, dinner, and one or two snacks every day.  Include protein in each meal and snack.  Choose whole grains instead of products that are made with refined flour.  Eat a variety of foods.  Exercise regularly as told by your health care provider. Aim to do at least 30 minutes of exercise on most days of the week.  If you are overweight or obese: ? Pay attention to how many calories you eat. Cutting down on calories can help you lose weight. ? Work with your health care provider or a dietitian to figure out how many calories you need each day. What foods should I eat? Fruits Include a variety of colors and types. All fruits are helpful for PCOS. Vegetables Include a variety of colors and types. All vegetables are helpful for PCOS. Grains Whole grains, such as whole wheat. Whole-grain breads, crackers, cereals, and pasta. Unsweetened oatmeal. Bulgur, barley, quinoa, and brown rice. Tortillas made from corn or whole-wheat flour. Meats and other proteins Lean proteins, such as fish, chicken, beans, eggs, and tofu. Dairy Low-fat dairy products, such as skim milk, cheese sticks, and yogurt. Beverages Low-fat or fat-free drinks, such as water, low-fat milk, sugar-free drinks, and [place] amounts of 100% fruit juice. Seasonings and condiments Ketchup. Mustard. Barbecue sauce. Relish. Low-fat or fat-free mayonnaise. Fats and  oils Olive oil or canola oil. Walnuts and almonds. The items listed above may not be a complete list of recommended foods and beverages. Contact a dietitian for more options.   What foods should I avoid? Foods that are high in calories or fat, especially saturated or trans fats. Fried foods. Sweets. Products that are made from refined white flour, including white bread, pastries, white rice, and pasta. The items listed above may not be a complete list of foods and beverages to avoid. Contact a dietitian for more information. Summary  PCOS is a hormonal imbalance that affects a woman's reproductive system. It can cause problems with menstrual periods and make it hard to get and stay pregnant.  You can help to manage your PCOS by exercising regularly and eating a healthy, varied diet of vegetables, fruit, whole grains, lean protein, and low-fat dairy products.  Changing what you eat can improve the way that your body uses insulin, help your hormones reach normal levels, and help you lose weight. This information is not intended to replace advice given to you by your health care provider. Make sure you discuss any questions you have with your health care provider. Document Revised: 01/13/2020 Document Reviewed: 01/13/2020 Elsevier Patient Education  2021 Elsevier Inc.  Polycystic Ovary Syndrome  Polycystic ovarian syndrome (PCOS) is a common hormonal disorder among women of reproductive age. In most women with PCOS, Conely fluid-filled sacs (cysts) grow on the ovaries. PCOS can cause problems with menstrual periods and make it hard to get and stay pregnant. If this condition is not treated, it can lead to serious health problems, such as diabetes and heart disease. What are the causes? The cause of this condition is not known. It may be due to certain factors, such as:  Irregular menstrual cycle.  High levels of certain hormones.  Problems with the hormone that helps to control blood sugar  (insulin).  Certain genes. What increases the risk? You are more likely to develop this condition if you:  Have a family history  of PCOS or type 2 diabetes.  Are overweight, eat unhealthy foods, and are not active. These factors may cause problems with blood sugar control, which can contribute to PCOS or PCOS symptoms. What are the signs or symptoms? Symptoms of this condition include:  Ovarian cysts and sometimes pelvic pain.  Menstrual periods that are not regular or are too heavy.  Inability to get or stay pregnant.  Increased growth of hair on the face, chest, stomach, back, thumbs, thighs, or toes.  Acne or oily skin. Acne may develop during adulthood, and it may not get better with treatment.  Weight gain or obesity.  Patches of thickened and dark brown or black skin on the neck, arms, breasts, or thighs. How is this diagnosed? This condition is diagnosed based on:  Your medical history.  A physical exam that includes a pelvic exam. Your health care provider may look for areas of increased hair growth on your skin.  Tests, such as: ? An ultrasound to check the ovaries for cysts and to view the lining of the uterus. ? Blood tests to check levels of sugar (glucose), female hormone (testosterone), and female hormones (estrogen and progesterone). How is this treated? There is no cure for this condition, but treatment can help to manage symptoms and prevent more health problems from developing. Treatment varies depending on your symptoms and if you want to have a baby or if you need birth control. Treatment may include:  Making nutrition and lifestyle changes.  Taking the progesterone hormone to start a menstrual period.  Taking birth control pills to help you have regular menstrual periods.  Taking medicines such as: ? Medicines to make you ovulate, if you want to get pregnant. ? Medicine to reduce extra hair growth.  Having surgery in severe cases. This may involve  making Dutta holes in one or both of your ovaries. This decreases the amount of testosterone that your body makes. Follow these instructions at home:  Take over-the-counter and prescription medicines only as told by your health care provider.  Follow a healthy meal plan that includes lean proteins, complex carbohydrates, fresh fruits and vegetables, low-fat dairy products, healthy fats, and fiber.  If you are overweight, lose weight as told by your health care provider. Your health care provider can determine how much weight loss is best for you and can help you lose weight safely.  Keep all follow-up visits. This is important. Contact a health care provider if:  Your symptoms do not get better with medicine.  Your symptoms get worse or you develop new symptoms. Summary  Polycystic ovarian syndrome (PCOS) is a common hormonal disorder among women of reproductive age.  PCOS can cause problems with menstrual periods and make it hard to get and stay pregnant.  If this condition is not treated, it can lead to serious health problems, such as diabetes and heart disease.  There is no cure for this condition, but treatment can help to manage symptoms and prevent more health problems from developing. This information is not intended to replace advice given to you by your health care provider. Make sure you discuss any questions you have with your health care provider. Document Revised: 01/13/2020 Document Reviewed: 01/13/2020 Elsevier Patient Education  2021 [employer].

## 2020-10-05 LAB — CYTOLOGY - PAP
Chlamydia: NEGATIVE
Comment: NEGATIVE
Comment: NORMAL
Diagnosis: NEGATIVE
Neisseria Gonorrhea: NEGATIVE

## 2020-11-03 ENCOUNTER — Other Ambulatory Visit: Payer: Self-pay

## 2020-11-03 MED ORDER — AMPHETAMINE-DEXTROAMPHETAMINE 10 MG PO TABS: ORAL_TABLET | ORAL | 0 refills | Status: DC

## 2021-01-08 ENCOUNTER — Other Ambulatory Visit: Payer: Self-pay

## 2021-01-09 MED ORDER — AMPHETAMINE-DEXTROAMPHETAMINE 10 MG PO TABS: ORAL_TABLET | ORAL | 0 refills | Status: DC

## 2021-02-07 ENCOUNTER — Encounter: Payer: Self-pay | Admitting: Adult Health

## 2021-03-07 ENCOUNTER — Other Ambulatory Visit: Payer: Self-pay

## 2021-03-08 MED ORDER — AMPHETAMINE-DEXTROAMPHETAMINE 10 MG PO TABS: ORAL_TABLET | ORAL | 0 refills | Status: DC

## 2021-05-15 ENCOUNTER — Other Ambulatory Visit: Payer: Self-pay

## 2021-05-15 MED ORDER — AMPHETAMINE-DEXTROAMPHETAMINE 10 MG PO TABS: ORAL_TABLET | ORAL | 0 refills | Status: DC

## 2021-07-15 ENCOUNTER — Other Ambulatory Visit: Payer: Self-pay | Admitting: Nurse Practitioner

## 2021-07-16 MED ORDER — AMPHETAMINE-DEXTROAMPHETAMINE 10 MG PO TABS: ORAL_TABLET | ORAL | 0 refills | Status: DC

## 2021-09-21 ENCOUNTER — Other Ambulatory Visit: Payer: Self-pay | Admitting: Nurse Practitioner

## 2021-09-21 DIAGNOSIS — R059 Cough, unspecified: Secondary | ICD-10-CM | POA: Diagnosis not present

## 2021-09-21 DIAGNOSIS — Z20822 Contact with and (suspected) exposure to covid-19: Secondary | ICD-10-CM | POA: Diagnosis not present

## 2021-09-21 DIAGNOSIS — J00 Acute nasopharyngitis [common cold]: Secondary | ICD-10-CM | POA: Diagnosis not present

## 2021-09-24 ENCOUNTER — Other Ambulatory Visit: Payer: Self-pay | Admitting: Nurse Practitioner

## 2021-09-24 DIAGNOSIS — F900 Attention-deficit hyperactivity disorder, predominantly inattentive type: Secondary | ICD-10-CM

## 2021-09-24 MED ORDER — AMPHETAMINE-DEXTROAMPHETAMINE 10 MG PO TABS: ORAL_TABLET | ORAL | 0 refills | Status: DC

## 2021-09-24 NOTE — Progress Notes (Signed)
Refill for Adderall was refilled since an appointment was scheduled for 10/16/21.  If she does not keep this appointment can no longer refill medication.

## 2021-10-16 ENCOUNTER — Encounter: Payer: Self-pay | Admitting: Internal Medicine

## 2021-10-16 ENCOUNTER — Ambulatory Visit: Payer: BC Managed Care – PPO | Admitting: Internal Medicine

## 2021-10-16 ENCOUNTER — Other Ambulatory Visit: Payer: Self-pay

## 2021-10-16 VITALS — BP 120/78 | HR 82 | Temp 97.9°F | Resp 17 | Ht 66.0 in | Wt 216.0 lb

## 2021-10-16 DIAGNOSIS — Z01419 Encounter for gynecological examination (general) (routine) without abnormal findings: Secondary | ICD-10-CM | POA: Diagnosis not present

## 2021-10-16 DIAGNOSIS — Z113 Encounter for screening for infections with a predominantly sexual mode of transmission: Secondary | ICD-10-CM | POA: Diagnosis not present

## 2021-10-16 DIAGNOSIS — Z6833 Body mass index (BMI) 33.0-33.9, adult: Secondary | ICD-10-CM | POA: Diagnosis not present

## 2021-10-16 DIAGNOSIS — Z124 Encounter for screening for malignant neoplasm of cervix: Secondary | ICD-10-CM | POA: Diagnosis not present

## 2021-10-16 DIAGNOSIS — F900 Attention-deficit hyperactivity disorder, predominantly inattentive type: Secondary | ICD-10-CM

## 2021-10-16 MED ORDER — METHYLPHENIDATE HCL 10 MG PO TABS: ORAL_TABLET | ORAL | 0 refills | Status: DC

## 2021-10-16 NOTE — Progress Notes (Signed)
° ° °  Future Appointments  Date Time Provider Department  10/16/2021  3:30 PM Lucky Cowboy, MD [facility]  03/29/2022 10:00 AM Revonda Humphrey, NP [facility]   History of Present Illness:     Patient is a very nice 26 yo single WF with hx/o ADD on low dose  and currently living in [place] with her fiance. Reports that she working in a [employer].  Patient has been treated with low dose Adderall for her ADD, but unable to obtain for the last 3 weeks due to a nationwide shortage of Adderall. When taking, she reports improvement in mood, focus & concentration.   Medications  Current Outpatient Medications (Other):    amphetamine-dextroamphetamine (ADDERALL) 10 MG tablet, Take 1/2-1 tab in the morning, then 1/2 tab in the afternoon if needed. Avoid taking daily, skip on days not working.   megestrol (MEGACE) 40 MG tablet, Take 1 tablet (40 mg total) by mouth 2 (two) times daily.  Problem list  She has Attention deficit hyperactivity disorder (ADHD) and Obesity (BMI 30.0-34.9) on their problem list.   Observations/Objective:  BP 120/78    Pulse 82    Temp 97.9 F (36.6 C)    Resp 17    Ht 5\' 6"  (1.676 m)    Wt 216 lb (98 kg)    SpO2 95%    BMI 34.86 kg/m   HEENT - WNL. Neck - supple.  Chest - Clear equal BS. Cor - Nl HS. RRR w/o sig m MS- FROM w/o deformities.  Gait Nl. Neuro -  Nl w/o focal abnormalities.   Assessment and Plan:  1. Attention deficit hyperactivity disorder (ADHD),  predominantly inattentive type  - methylphenidate (RITALIN) 10 MG tablet;  Take  1/2 to 1 tablet  2 to 3  x /day  for ADD, Mood, Focus & Concentration   Dispense: 90 tablet; Refill: 0  - Discussed starting with 1/2 tablet & taking lowest dose &  to call before net refill to inform of dose taking.   - has 6 mo CPE scheduled with in August    Follow Up Instructions:       I discussed the assessment and treatment plan with the patient. The patient was provided an opportunity to ask  questions and all were answered. The patient agreed with the plan and demonstrated an understanding of the instructions.       The patient was advised to call back or seek an in-person evaluation if the symptoms worsen or if the condition fails to improve as anticipated.   September, MD

## 2021-10-16 NOTE — Patient Instructions (Addendum)
Living With Attention Deficit Hyperactivity Disorder If you have been diagnosed with attention deficit hyperactivity disorder (ADHD), you may be relieved that you now know why you have felt or behaved a certain way. Still, you may feel overwhelmed about the treatment ahead. You may also wonder how to get the support you need and how to deal with the condition day-to-day. With treatment and support, you can live with ADHD and manage your symptoms. How to manage lifestyle changes Managing stress Stress is your body's reaction to life changes and events, both good and bad. To cope with the stress of an ADHD diagnosis, it may help to: Learn more about ADHD. Exercise regularly. Even a short daily walk can lower stress levels. Participate in training or education programs (including social skills training classes) that teach you to deal with symptoms.  Medicines Your health care provider may suggest certain medicines if he or she feels that they will help to improve your condition. Stimulant medicines are usually prescribed to treat ADHD, and therapy may also be prescribed. It is important to: Avoid using alcohol and other substances that may prevent your medicines from working properly (may interact). Talk with your pharmacist or health care provider about all the medicines that you take, their possible side effects, and what medicines are safe to take together. Make it your goal to take part in all treatment decisions (shared decision-making). Ask about possible side effects of medicines that your health care provider recommends, and tell him or her how you feel about having those side effects. It is best if shared decision-making with your health care provider is part of your total treatment plan. Relationships To strengthen your relationships with family members while treating your condition, consider taking part in family therapy. You might also attend self-help groups alone or with a loved one. Be  honest about how your symptoms affect your relationships. Make an effort to communicate respectfully instead of fighting, and find ways to show others that you care. Psychotherapy may be useful in helping you cope with how ADHD affects your relationships. How to recognize changes in your condition The following signs may mean that your treatment is working well and your condition is improving: Consistently being on time for appointments. Being more organized at home and work. Other people noticing improvements in your behavior. Achieving goals that you set for yourself. Thinking more clearly. The following signs may mean that your treatment is not working very well: Feeling impatience or more confusion. Missing, forgetting, or being late for appointments. An increasing sense of disorganization and messiness. More difficulty in reaching goals that you set for yourself. Loved ones becoming angry or frustrated with you. Follow these instructions at home: Take over-the-counter and prescription medicines only as told by your health care provider. Check with your health care provider before taking any new medicines. Create structure and an organized atmosphere at home. For example: Make a list of tasks, then rank them from most important to least important. Work on one task at a time until your listed tasks are done. Make a daily schedule and follow it consistently every day. Use an appointment calendar, and check it 2 or 3 times a day to keep on track. Keep it with you when you leave the house. Create spaces where you keep certain things, and always put things back in their places after you use them. Keep all follow-up visits as told by your health care provider. This is important. Where to find support Talking to others  Keep emotion out of important discussions and speak in a calm, logical way. Listen closely and patiently to your loved ones. Try to understand their point of view, and try to  avoid getting defensive. Take responsibility for the consequences of your actions. Ask that others do not take your behaviors personally. Aim to solve problems as they come up, and express your feelings instead of bottling them up. Talk openly about what you need from your loved ones and how they can support you. Consider going to family therapy sessions or having your family meet with a specialist who deals with ADHD-related behavior problems. Finances Not all insurance plans cover mental health care, so it is important to check with your insurance carrier. If paying for co-pays or counseling services is a problem, search for a local or county mental health care center. Public mental health care services may be offered there at a low cost or no cost when you are not able to see a private health care provider. If you are taking medicine for ADHD, you may be able to get the generic form, which may be less expensive than brand-name medicine. Some makers of prescription medicines also offer help to patients who cannot afford the medicines that they need. Questions to ask your health care provider: What are the risks and benefits of taking medicines? Would I benefit from therapy? How often should I follow up with a health care provider? Contact a health care provider if: You have side effects from your medicines, such as: Repeated muscle twitches, coughing, or speech outbursts. Sleep problems. Loss of appetite. Depression. New or worsening behavior problems. Dizziness. Unusually fast heartbeat. Stomach pains. Headaches. Get help right away if: You have a severe reaction to a medicine. Your behavior suddenly gets worse. Summary With treatment and support, you can live with ADHD and manage your symptoms. The medicines that are most often prescribed for ADHD are stimulants. Consider taking part in family therapy or self-help groups with family members or friends. When you talk with friends  and family about your ADHD, be patient and communicate openly. Take over-the-counter and prescription medicines only as told by your health care provider. Check with your health care provider before taking any new medicines. This information is not intended to replace advice given to you by your health care provider. Make sure you discuss any questions you have with your health care provider. Document Revised: 01/19/2020 Document Reviewed: 01/19/2020 Elsevier Patient Education  2022 Elsevier Inc.   ===============================  Supporting Someone With Attention Deficit Hyperactivity Disorder Attention deficit hyperactivity disorder (ADHD) is a behavior problem that is present in a person due to the way that his or her brain functions (neurobehavioral disorder). It is a common cause of behavioral and learning (academic) problems among children. ADHD is a long-term (chronic) condition. If this disorder is not treated, it can have serious effects into adolescence and adulthood. When a person has ADHD, his or her condition can affect others around him or her, such as friends and family members. Friends and family can help by offering support and understanding. What do I need to know about this condition? ADHD can affect daily functioning in ways that often cause problems for the person with ADHD and his or her friends and family members. A child with ADHD may: Have a poor attention span. This means that he or she can only stay focused or interested in something for a short time. Get distracted easily. Have trouble listening to instructions. Daydream. Make  careless mistakes. Be forgetful. Talk too much, such as blurting out answers to questions. Have trouble sitting still for long. Fidget or get out of his or her seat during class. An adult with ADHD may: Get distracted easily. Be disorganized at home and work. Miss, forget, or be late for appointments. Have trouble with details. Have  trouble completing tasks. Be irritable and impatient. Get bored easily during meetings. Have great difficulty concentrating. What do I need to know about the treatment options? Treatment for this condition usually involves: Behavioral treatment. Working with a Paramedic, the person with ADHD may: Set rewards for desired behavior. Set Curt goals and clear expectations, and be held accountable for meeting them. Get help with planning and timing activities. Become more patient and more mindful of the condition. Medicines, such as: Stimulant medicines that help a person to: Control his or her behavior (decrease impulsivity). Control his or her extra physical activity (decrease hyperactivity). Increase his or her ability to pay attention. Antidepressants. Certain blood pressure medicines. Structured classroom management for children at school, such as tutoring or extra support in classes. Techniques for parents to use at home to help manage their child's symptoms and behavior. These include rewarding good behavior, providing consistent discipline, and setting limits. How can I support my loved one? Talk about the condition Pick a time to talk with your loved one when distractions and interruptions are unlikely. Let your loved one know that he or she is capable of success. Focus on your loved one's strengths, and try to not let your loved one use ADHD as an excuse for undesirable behavior. Let your loved one know that there are well-known, successful people who also have ADHD. This may be encouraging to your loved one. Give your loved one time to process his or her thoughts and to ask questions. Children with ADHD may benefit from hearing more about how their treatment plan will help them. This may help them focus on goal behaviors. Find support and resources A health care provider may be able to recommend resources that are available online or over the phone. You could start with: Attention  Deficit Disorder Association (ADDA): [employer] of Mental Health [facility]): [employer].com.cy.shtml Training classes or conferences that help parents of children with ADHD to support their children and cope with the disorder. Support groups for families who are affected by ADHD. General support If you are a parent of a child with ADHD, you can take the following actions to support your child's education: Talk to teachers about the ways that your child learns best. Be your child's advocate and stay in touch with his or her school about all problems related to ADHD. At the end of the summer, make appointments to talk with teachers and other school staff before the new school year begins. Listen to teachers carefully, and share your child's history with them. Create a behavior plan that your child, your family, and the teachers can agree on. Write down goals to help your child succeed. How should I care for myself? It is important to find ways to care for your body, mind, and well-being while supporting someone with ADHD. Spend time with friends and family. Find someone you can talk to who will also help you work on using coping skills to manage stress. Understand what your limits are. Say "no" to requests or events that lead to a schedule that is too busy. Make time for activities that help you relax, and try to not feel guilty  about taking time for yourself. Consider trying meditation and deep breathing exercises to lower your stress. Get plenty of sleep. Exercise, even if it is just taking a short walk a few times a week. If you are a parent of a child with ADHD, arrange for child care so you can take breaks once in a while. What are some signs that the condition is getting worse? Signs that your loved one's condition may be getting worse include: Increased trouble completing tasks and paying  attention. Hyperactivity and impulsivity. Problems with relationships. Impatience, restlessness, and mood swings. Worsening problems at school, if applicable. Contact a health care provider if: Your loved one's symptoms get worse. Your loved one shows signs of depression, anxiety, or another mental health condition. Your child has behavioral problems at school. Summary Attention deficit hyperactivity disorder (ADHD) is a long-term (chronic) condition that can affect daily functioning in ways that often cause problems for the person with ADHD and his or her loved ones. This disorder can be treated effectively with medicine, behavioral treatment, and techniques to manage symptoms and behaviors. Many organizations and groups are available to help families to manage ADHD. The support people in the life of someone with ADHD play an extremely important role in helping that person develop healthy behaviors to live a satisfying life. It is important to find ways to care for your own body, mind, and well-being while supporting someone with ADHD. Make time for activities that help you relax. This information is not intended to replace advice given to you by your health care provider. Make sure you discuss any questions you have with your health care provider. Document Revised: 01/19/2020 Document Reviewed: 01/19/2020 Elsevier Patient Education  2022 [employer].

## 2021-10-25 DIAGNOSIS — Z3202 Encounter for pregnancy test, result negative: Secondary | ICD-10-CM | POA: Diagnosis not present

## 2021-10-25 DIAGNOSIS — Z30433 Encounter for removal and reinsertion of intrauterine contraceptive device: Secondary | ICD-10-CM | POA: Diagnosis not present

## 2021-12-12 DIAGNOSIS — Z30431 Encounter for routine checking of intrauterine contraceptive device: Secondary | ICD-10-CM | POA: Diagnosis not present

## 2021-12-20 ENCOUNTER — Telehealth: Payer: 59 | Admitting: Physician Assistant

## 2021-12-20 DIAGNOSIS — B3731 Acute candidiasis of vulva and vagina: Secondary | ICD-10-CM

## 2021-12-20 MED ORDER — FLUCONAZOLE 150 MG PO TABS: 150.0000 mg | ORAL_TABLET | Freq: Once | ORAL | 0 refills | Status: DC

## 2021-12-20 MED ORDER — FLUCONAZOLE 150 MG PO TABS: 150.0000 mg | ORAL_TABLET | Freq: Once | ORAL | 0 refills | Status: AC

## 2021-12-20 NOTE — Progress Notes (Signed)

## 2022-01-01 DIAGNOSIS — Z3040 Encounter for surveillance of contraceptives, unspecified: Secondary | ICD-10-CM | POA: Diagnosis not present

## 2022-01-01 DIAGNOSIS — Z3009 Encounter for other general counseling and advice on contraception: Secondary | ICD-10-CM | POA: Diagnosis not present

## 2022-01-01 DIAGNOSIS — Z30431 Encounter for routine checking of intrauterine contraceptive device: Secondary | ICD-10-CM | POA: Diagnosis not present

## 2022-01-01 DIAGNOSIS — E282 Polycystic ovarian syndrome: Secondary | ICD-10-CM | POA: Diagnosis not present

## 2022-03-29 ENCOUNTER — Encounter: Payer: BC Managed Care – PPO | Admitting: Nurse Practitioner

## 2022-07-02 ENCOUNTER — Ambulatory Visit: Payer: BC Managed Care – PPO | Admitting: Nurse Practitioner

## 2022-07-02 ENCOUNTER — Encounter: Payer: Self-pay | Admitting: Nurse Practitioner

## 2022-07-02 VITALS — BP 128/80 | HR 84 | Temp 97.8°F | Resp 16 | Ht 66.0 in | Wt 216.8 lb

## 2022-07-02 DIAGNOSIS — E282 Polycystic ovarian syndrome: Secondary | ICD-10-CM | POA: Diagnosis not present

## 2022-07-02 DIAGNOSIS — E782 Mixed hyperlipidemia: Secondary | ICD-10-CM

## 2022-07-02 DIAGNOSIS — F900 Attention-deficit hyperactivity disorder, predominantly inattentive type: Secondary | ICD-10-CM | POA: Diagnosis not present

## 2022-07-02 DIAGNOSIS — E669 Obesity, unspecified: Secondary | ICD-10-CM | POA: Diagnosis not present

## 2022-07-02 DIAGNOSIS — Z79899 Other long term (current) drug therapy: Secondary | ICD-10-CM | POA: Diagnosis not present

## 2022-07-02 DIAGNOSIS — Z30431 Encounter for routine checking of intrauterine contraceptive device: Secondary | ICD-10-CM | POA: Diagnosis not present

## 2022-07-02 LAB — CBC WITH DIFFERENTIAL/PLATELET
Eosinophils Absolute: 81 cells/uL (ref 15–500)
Eosinophils Relative: 1.4 %
Hemoglobin: 14.3 g/dL (ref 11.7–15.5)
Platelets: 254 10*3/uL (ref 140–400)
Total Lymphocyte: 30.4 %

## 2022-07-02 MED ORDER — AMPHETAMINE-DEXTROAMPHET ER 20 MG PO CP24: 20.0000 mg | ORAL_CAPSULE | Freq: Every day | ORAL | 0 refills | Status: DC

## 2022-07-02 NOTE — Patient Instructions (Signed)

## 2022-07-02 NOTE — Progress Notes (Signed)
Assessment and Plan:  [place] was seen today for a follow up.  Diagnoses and all order for this visit:  Attention deficit hyperactivity disorder (ADHD), predominantly inattentive type Medications providing over 30% improvement of attention deficit hyperactivity disorder. Change in management from Ritalin to Adderall Prescribed lowest effective dose. Discussed alternative pharmacological and non-pharmacological therapies. No suspected aberrant drug-taking behaviors. Continue to monitor   Obesity (BMI 30.0-34.9) Discussed appropriate BMI Diet modification. Physical activity. Encouraged/praised to build confidence.  - Lipid panel - TSH  Medication management All medications discussed and reviewed in full. All questions and concerns regarding medications addressed.    - CBC with Differential/Platelet - COMPLETE METABOLIC PANEL WITH GFR - Lipid panel - TSH  Notify office for further evaluation and treatment, questions or concerns if s/s fail to improve. The risks and benefits of my recommendations, as well as other treatment options were discussed with the patient today. Questions were answered.  Further disposition pending results of labs. Discussed med's effects and SE's.    Over 20 minutes of exam, counseling, chart review, and critical decision making was performed.   No future appointments.  ------------------------------------------------------------------------------------------------------------------   HPI BP 128/80   Pulse 84   Temp 97.8 F (36.6 C)   Resp 16   Ht 5\' 6"  (1.676 m)   Wt 216 lb 12.8 oz (98.3 kg)   SpO2 99%   BMI 34.99 kg/m   26 y.o.female presents for prescription refill and medication management.  Overall she reports feeling well.  She is currently planning a wedding and will be getting married in 10 days.  She resided in [place] and continues to have her healthcare here.  She is not interested in switching providers.    During her  last OV 09/2021 she was started on Ritalin d/t Adderall shortage.  In the past she was prescribed Adderall 10 mg IR and reports feeling as though she would forget the afternoon dose.  She is interested in having the medication last longer, feels as thought he IR wears off too soon.  Reports medication compliance.  Denies CP, heart palpitations, insomnia.   BMI is Body mass index is 34.99 kg/m., she has been working on diet and exercise. Wt Readings from Last 3 Encounters:  07/02/22 216 lb 12.8 oz (98.3 kg)  10/16/21 216 lb (98 kg)  10/02/20 214 lb (97.1 kg)    Past Medical History:  Diagnosis Date   Abnormal Pap smear of cervix    HPV   STD (sexually transmitted disease)    HPV     Allergies  Allergen Reactions   Amoxicillin Rash    Current Outpatient Medications on File Prior to Visit  Medication Sig   methylphenidate (RITALIN) 10 MG tablet Take  1/2 to 1 tablet  2 to 3  x /day  for ADD, Mood, Focus & Concentration   No current facility-administered medications on file prior to visit.    ROS: all negative except what is noted in the HPI.   Physical Exam:  BP 128/80   Pulse 84   Temp 97.8 F (36.6 C)   Resp 16   Ht 5\' 6"  (1.676 m)   Wt 216 lb 12.8 oz (98.3 kg)   SpO2 99%   BMI 34.99 kg/m   General Appearance: NAD.  Awake, conversant and cooperative. Eyes: PERRLA, EOMs intact.  Sclera white.  Conjunctiva without erythema. Sinuses: No frontal/maxillary tenderness.  No nasal discharge. Nares patent.  ENT/Mouth: Ext aud canals clear.  Bilateral TMs  w/DOL and without erythema or bulging. Hearing intact.  Posterior pharynx without swelling or exudate.  Tonsils without swelling or erythema.  Neck: Supple.  No masses, nodules or thyromegaly. Respiratory: Effort is regular with non-labored breathing. Breath sounds are equal bilaterally without rales, rhonchi, wheezing or stridor.  Cardio: RRR with no MRGs. Brisk peripheral pulses without edema.  Abdomen: Active BS in all  four quadrants.  Soft and non-tender without guarding, rebound tenderness, hernias or masses. Lymphatics: Non tender without lymphadenopathy.  Musculoskeletal: Full ROM, 5/5 strength, normal ambulation.  No clubbing or cyanosis. Skin: Appropriate color for ethnicity. Warm without rashes, lesions, ecchymosis, ulcers.  Neuro: CN II-XII grossly normal. Normal muscle tone without cerebellar symptoms and intact sensation.   Psych: AO X 3,  appropriate mood and affect, insight and judgment.     Adela Glimpse, NP 9:00 AM [facility]

## 2022-07-03 LAB — COMPLETE METABOLIC PANEL WITH GFR
AG Ratio: 1.9 (calc) (ref 1.0–2.5)
ALT: 22 U/L (ref 6–29)
AST: 18 U/L (ref 10–30)
Albumin: 4.5 g/dL (ref 3.6–5.1)
Alkaline phosphatase (APISO): 46 U/L (ref 31–125)
BUN: 10 mg/dL (ref 7–25)
CO2: 25 mmol/L (ref 20–32)
Calcium: 9.1 mg/dL (ref 8.6–10.2)
Chloride: 106 mmol/L (ref 98–110)
Creat: 0.84 mg/dL (ref 0.50–0.96)
Globulin: 2.4 g/dL (calc) (ref 1.9–3.7)
Glucose, Bld: 86 mg/dL (ref 65–99)
Potassium: 4.1 mmol/L (ref 3.5–5.3)
Sodium: 138 mmol/L (ref 135–146)
Total Bilirubin: 0.4 mg/dL (ref 0.2–1.2)
Total Protein: 6.9 g/dL (ref 6.1–8.1)
eGFR: 98 mL/min/{1.73_m2} (ref >=60)

## 2022-07-03 LAB — CBC WITH DIFFERENTIAL/PLATELET
Absolute Monocytes: 481 cells/uL (ref 200–950)
Basophils Absolute: 17 cells/uL (ref 0–200)
Basophils Relative: 0.3 %
HCT: 42.6 % (ref 35.0–45.0)
Lymphs Abs: 1763 cells/uL (ref 850–3900)
MCH: 28.8 pg (ref 27.0–33.0)
MCHC: 33.6 g/dL (ref 32.0–36.0)
MCV: 85.9 fL (ref 80.0–100.0)
MPV: 10.2 fL (ref 7.5–12.5)
Monocytes Relative: 8.3 %
Neutro Abs: 3457 cells/uL (ref 1500–7800)
Neutrophils Relative %: 59.6 %
RBC: 4.96 10*6/uL (ref 3.80–5.10)
RDW: 12.9 % (ref 11.0–15.0)
WBC: 5.8 10*3/uL (ref 3.8–10.8)

## 2022-07-03 LAB — LIPID PANEL
Cholesterol: 158 mg/dL (ref <200)
HDL: 47 mg/dL — ABNORMAL LOW (ref >=50)
LDL Cholesterol (Calc): 94 mg/dL (calc)
Non-HDL Cholesterol (Calc): 111 mg/dL (calc) (ref <130)
Total CHOL/HDL Ratio: 3.4 (calc) (ref <5.0)
Triglycerides: 84 mg/dL (ref <150)

## 2022-07-03 LAB — TSH: TSH: 0.85 mIU/L

## 2022-07-08 ENCOUNTER — Other Ambulatory Visit: Payer: Self-pay

## 2022-07-10 ENCOUNTER — Encounter: Payer: Self-pay | Admitting: Nurse Practitioner

## 2022-07-10 ENCOUNTER — Telehealth: Payer: Self-pay

## 2022-07-10 NOTE — Telephone Encounter (Signed)
Prior auth approved through 07/10/23. Patient aware.    Prior auth for Adderall completed and submitted.

## 2022-07-26 ENCOUNTER — Telehealth: Payer: Self-pay | Admitting: Nurse Practitioner

## 2022-07-26 NOTE — Telephone Encounter (Signed)
Patient is requesting that her script for adderall be transferred from the [facility] pharmacy to [place] on [address] and [address]

## 2022-07-28 ENCOUNTER — Other Ambulatory Visit: Payer: Self-pay | Admitting: Nurse Practitioner

## 2022-07-28 MED ORDER — AMPHETAMINE-DEXTROAMPHET ER 20 MG PO CP24: 20.0000 mg | ORAL_CAPSULE | Freq: Every day | ORAL | 0 refills | Status: DC

## 2022-10-04 ENCOUNTER — Other Ambulatory Visit: Payer: Self-pay | Admitting: Nurse Practitioner

## 2022-10-04 MED ORDER — AMPHETAMINE-DEXTROAMPHET ER 20 MG PO CP24: 20.0000 mg | ORAL_CAPSULE | Freq: Every day | ORAL | 0 refills | Status: DC

## 2022-11-06 ENCOUNTER — Telehealth: Payer: Self-pay | Admitting: Physician Assistant

## 2022-11-06 DIAGNOSIS — B379 Candidiasis, unspecified: Secondary | ICD-10-CM

## 2022-11-06 DIAGNOSIS — T3695XA Adverse effect of unspecified systemic antibiotic, initial encounter: Secondary | ICD-10-CM

## 2022-11-06 MED ORDER — FLUCONAZOLE 150 MG PO TABS: 150.0000 mg | ORAL_TABLET | ORAL | 0 refills | Status: DC | PRN

## 2022-11-06 NOTE — Progress Notes (Signed)
E-Visit for Vaginal Symptoms  We are sorry that you are not feeling well. Here is how we plan to help! Based on what you shared with me it looks like you: May have a yeast vaginosis secondary to recent antibiotic use.   Vaginosis is an inflammation of the vagina that can result in discharge, itching and pain. The cause is usually a change in the normal balance of vaginal bacteria or an infection. Vaginosis can also result from reduced estrogen levels after menopause.  The most common causes of vaginosis are:   Bacterial vaginosis which results from an overgrowth of one on several organisms that are normally present in your vagina.   Yeast infections which are caused by a naturally occurring fungus called candida.   Vaginal atrophy (atrophic vaginosis) which results from the thinning of the vagina from reduced estrogen levels after menopause.   Trichomoniasis which is caused by a parasite and is commonly transmitted by sexual intercourse.  Factors that increase your risk of developing vaginosis include: Medications, such as antibiotics and steroids Uncontrolled diabetes Use of hygiene products such as bubble bath, vaginal spray or vaginal deodorant Douching Wearing damp or tight-fitting clothing Using an intrauterine device (IUD) for birth control Hormonal changes, such as those associated with pregnancy, birth control pills or menopause Sexual activity Having a sexually transmitted infection  Your treatment plan is Diflucan (fluconazole) 150mg tablet once, may repeat in 72 hours if needed.  I have electronically sent this prescription into the pharmacy that you have chosen.  Be sure to take all of the medication as directed. Stop taking any medication if you develop a rash, tongue swelling or shortness of breath. Mothers who are breast feeding should consider pumping and discarding their breast milk while on these antibiotics. However, there is no consensus that infant exposure at these  doses would be harmful.  Remember that medication creams can weaken latex condoms. .   HOME CARE:  Good hygiene may prevent some types of vaginosis from recurring and may relieve some symptoms:  Avoid baths, hot tubs and whirlpool spas. Rinse soap from your outer genital area after a shower, and dry the area well to prevent irritation. Don't use scented or harsh soaps, such as those with deodorant or antibacterial action. Avoid irritants. These include scented tampons and pads. Wipe from front to back after using the toilet. Doing so avoids spreading fecal bacteria to your vagina.  Other things that may help prevent vaginosis include:  Don't douche. Your vagina doesn't require cleansing other than normal bathing. Repetitive douching disrupts the normal organisms that reside in the vagina and can actually increase your risk of vaginal infection. Douching won't clear up a vaginal infection. Use a latex condom. Both female and female latex condoms may help you avoid infections spread by sexual contact. Wear cotton underwear. Also wear pantyhose with a cotton crotch. If you feel comfortable without it, skip wearing underwear to bed. Yeast thrives in moist environments Your symptoms should improve in the next day or two.  GET HELP RIGHT AWAY IF:  You have pain in your lower abdomen ( pelvic area or over your ovaries) You develop nausea or vomiting You develop a fever Your discharge changes or worsens You have persistent pain with intercourse You develop shortness of breath, a rapid pulse, or you faint.  These symptoms could be signs of problems or infections that need to be evaluated by a medical provider now.  MAKE SURE YOU   Understand these instructions. Will watch   your condition. Will get help right away if you are not doing well or get worse.  Thank you for choosing an e-visit.  Your e-visit answers were reviewed by a board certified advanced clinical practitioner to complete  your personal care plan. Depending upon the condition, your plan could have included both over the counter or prescription medications.  Please review your pharmacy choice. Make sure the pharmacy is open so you can pick up prescription now. If there is a problem, you may contact your provider through MyChart messaging and have the prescription routed to another pharmacy.  Your safety is important to us. If you have drug allergies check your prescription carefully.   For the next 24 hours you can use MyChart to ask questions about today's visit, request a non-urgent call back, or ask for a work or school excuse. You will get an email in the next two days asking about your experience. I hope that your e-visit has been valuable and will speed your recovery.  I have spent 5 minutes in review of e-visit questionnaire, review and updating patient chart, medical decision making and response to patient.   Jill Stopka M Keena Heesch, PA-C  

## 2022-11-18 ENCOUNTER — Other Ambulatory Visit: Payer: Self-pay | Admitting: Nurse Practitioner

## 2022-11-19 MED ORDER — AMPHETAMINE-DEXTROAMPHET ER 20 MG PO CP24: 20.0000 mg | ORAL_CAPSULE | Freq: Every day | ORAL | 0 refills | Status: DC

## 2022-12-31 ENCOUNTER — Ambulatory Visit: Payer: 59 | Admitting: Nurse Practitioner

## 2023-01-14 ENCOUNTER — Other Ambulatory Visit: Payer: Self-pay | Admitting: Nurse Practitioner

## 2023-01-14 MED ORDER — AMPHETAMINE-DEXTROAMPHET ER 20 MG PO CP24: 20.0000 mg | ORAL_CAPSULE | Freq: Every day | ORAL | 0 refills | Status: DC

## 2023-01-23 ENCOUNTER — Ambulatory Visit: Payer: 59 | Admitting: Nurse Practitioner

## 2023-01-29 ENCOUNTER — Encounter: Payer: Self-pay | Admitting: Nurse Practitioner

## 2023-01-29 ENCOUNTER — Ambulatory Visit: Payer: 59 | Admitting: Nurse Practitioner

## 2023-01-29 VITALS — BP 118/70 | HR 78 | Temp 97.6°F | Ht 66.0 in | Wt 226.2 lb

## 2023-01-29 DIAGNOSIS — Z79899 Other long term (current) drug therapy: Secondary | ICD-10-CM | POA: Diagnosis not present

## 2023-01-29 DIAGNOSIS — F900 Attention-deficit hyperactivity disorder, predominantly inattentive type: Secondary | ICD-10-CM | POA: Diagnosis not present

## 2023-01-29 DIAGNOSIS — E669 Obesity, unspecified: Secondary | ICD-10-CM

## 2023-01-29 NOTE — Progress Notes (Signed)
Assessment and Plan:  Christy Galloway was seen today for a follow up.  Diagnoses and all order for this visit:  Attention deficit hyperactivity disorder (ADHD), predominantly inattentive type Medications providing over 30% improvement of attention deficit hyperactivity disorder. Change in management from Ritalin to Adderall Prescribed lowest effective dose. Discussed alternative pharmacological and non-pharmacological therapies. No suspected aberrant drug-taking behaviors. Continue to monitor   Obesity (BMI 30.0-34.9) Discussed appropriate BMI Diet modification. Physical activity. Encouraged/praised to build confidence.  Medication management All medications discussed and reviewed in full. All questions and concerns regarding medications addressed.    Orders Placed This Encounter  Procedures   CBC with Differential/Platelet   COMPLETE METABOLIC PANEL WITH GFR    Notify office for further evaluation and treatment, questions or concerns if s/s fail to improve. The risks and benefits of my recommendations, as well as other treatment options were discussed with the patient today. Questions were answered.  Further disposition pending results of labs. Discussed med's effects and SE's.    Over 20 minutes of exam, counseling, chart review, and critical decision making was performed.   No future appointments.  ------------------------------------------------------------------------------------------------------------------   HPI BP 118/70   Pulse 78   Temp 97.6 F (36.4 C)   Ht 5\' 6"  (1.676 m)   Wt 226 lb 3.2 oz (102.6 kg)   SpO2 98%   BMI 36.51 kg/m   26 y.o.female presents for prescription refill and medication management.  Overall she reports feeling well.  She recently got married 06/2022 and celebrated a honeymoon in [place].   She resides in [place] and continues to have her healthcare here.  She is not interested in switching providers.    During her last OV 09/2021  she was started on Ritalin d/t Adderall shortage.  In the past she was prescribed Adderall 10 mg IR and reports feeling as though she would forget the afternoon dose.  She is interested in having the medication last longer, feels as thought he IR wears off too soon.  Reports medication compliance.  Denies CP, heart palpitations, insomnia.   BMI is Body mass index is 36.51 kg/m., she has been working on diet and exercise. Wt Readings from Last 3 Encounters:  01/29/23 226 lb 3.2 oz (102.6 kg)  07/02/22 216 lb 12.8 oz (98.3 kg)  10/16/21 216 lb (98 kg)    Past Medical History:  Diagnosis Date   Abnormal Pap smear of cervix    HPV   STD (sexually transmitted disease)    HPV     Allergies  Allergen Reactions   Amoxicillin Rash    Current Outpatient Medications on File Prior to Visit  Medication Sig   amphetamine-dextroamphetamine (ADDERALL XR) 20 MG 24 hr capsule Take 1 capsule (20 mg total) by mouth daily.   fluconazole (DIFLUCAN) 150 MG tablet Take 1 tablet (150 mg total) by mouth every 3 (three) days as needed.   methylphenidate (RITALIN) 10 MG tablet Take  1/2 to 1 tablet  2 to 3  x /day  for ADD, Mood, Focus & Concentration   spironolactone (ALDACTONE) 25 MG tablet Take 25 mg by mouth daily.   No current facility-administered medications on file prior to visit.    ROS: all negative except what is noted in the HPI.   Physical Exam:  BP 118/70   Pulse 78   Temp 97.6 F (36.4 C)   Ht 5\' 6"  (1.676 m)   Wt 226 lb 3.2 oz (102.6 kg)   SpO2 98%  BMI 36.51 kg/m   General Appearance: NAD.  Awake, conversant and cooperative. Eyes: PERRLA, EOMs intact.  Sclera white.  Conjunctiva without erythema. Sinuses: No frontal/maxillary tenderness.  No nasal discharge. Nares patent.  ENT/Mouth: Ext aud canals clear.  Bilateral TMs w/DOL and without erythema or bulging. Hearing intact.  Posterior pharynx without swelling or exudate.  Tonsils without swelling or erythema.  Neck: Supple.   No masses, nodules or thyromegaly. Respiratory: Effort is regular with non-labored breathing. Breath sounds are equal bilaterally without rales, rhonchi, wheezing or stridor.  Cardio: RRR with no MRGs. Brisk peripheral pulses without edema.  Abdomen: Active BS in all four quadrants.  Soft and non-tender without guarding, rebound tenderness, hernias or masses. Lymphatics: Non tender without lymphadenopathy.  Musculoskeletal: Full ROM, 5/5 strength, normal ambulation.  No clubbing or cyanosis. Skin: Appropriate color for ethnicity. Warm without rashes, lesions, ecchymosis, ulcers.  Neuro: CN II-XII grossly normal. Normal muscle tone without cerebellar symptoms and intact sensation.   Psych: AO X 3,  appropriate mood and affect, insight and judgment.     Adela Glimpse, NP 3:31 PM [facility]

## 2023-01-30 LAB — CBC WITH DIFFERENTIAL/PLATELET
Absolute Monocytes: 573 cells/uL (ref 200–950)
Basophils Absolute: 21 cells/uL (ref 0–200)
Basophils Relative: 0.3 %
Eosinophils Absolute: 159 cells/uL (ref 15–500)
Eosinophils Relative: 2.3 %
HCT: 42.8 % (ref 35.0–45.0)
Hemoglobin: 14 g/dL (ref 11.7–15.5)
Lymphs Abs: 2022 cells/uL (ref 850–3900)
MCH: 28.1 pg (ref 27.0–33.0)
MCHC: 32.7 g/dL (ref 32.0–36.0)
MCV: 85.9 fL (ref 80.0–100.0)
MPV: 10.7 fL (ref 7.5–12.5)
Monocytes Relative: 8.3 %
Neutro Abs: 4126 cells/uL (ref 1500–7800)
Neutrophils Relative %: 59.8 %
Platelets: 254 10*3/uL (ref 140–400)
RBC: 4.98 10*6/uL (ref 3.80–5.10)
RDW: 12.6 % (ref 11.0–15.0)
Total Lymphocyte: 29.3 %
WBC: 6.9 10*3/uL (ref 3.8–10.8)

## 2023-01-30 LAB — COMPLETE METABOLIC PANEL WITH GFR
AG Ratio: 2 (calc) (ref 1.0–2.5)
ALT: 32 U/L — ABNORMAL HIGH (ref 6–29)
AST: 22 U/L (ref 10–30)
Albumin: 4.7 g/dL (ref 3.6–5.1)
Alkaline phosphatase (APISO): 51 U/L (ref 31–125)
BUN: 10 mg/dL (ref 7–25)
CO2: 25 mmol/L (ref 20–32)
Calcium: 9.7 mg/dL (ref 8.6–10.2)
Chloride: 103 mmol/L (ref 98–110)
Creat: 0.87 mg/dL (ref 0.50–0.96)
Globulin: 2.3 g/dL (calc) (ref 1.9–3.7)
Glucose, Bld: 90 mg/dL (ref 65–99)
Potassium: 4.1 mmol/L (ref 3.5–5.3)
Sodium: 137 mmol/L (ref 135–146)
Total Bilirubin: 0.3 mg/dL (ref 0.2–1.2)
Total Protein: 7 g/dL (ref 6.1–8.1)
eGFR: 94 mL/min/{1.73_m2} (ref >=60)

## 2023-03-03 ENCOUNTER — Ambulatory Visit: Payer: 59 | Admitting: Nurse Practitioner

## 2023-03-03 ENCOUNTER — Encounter: Payer: Self-pay | Admitting: Nurse Practitioner

## 2023-03-03 VITALS — BP 118/70 | HR 84 | Temp 97.5°F | Ht 66.0 in | Wt 222.8 lb

## 2023-03-03 DIAGNOSIS — E669 Obesity, unspecified: Secondary | ICD-10-CM

## 2023-03-03 DIAGNOSIS — Z79899 Other long term (current) drug therapy: Secondary | ICD-10-CM | POA: Diagnosis not present

## 2023-03-03 NOTE — Progress Notes (Unsigned)
Assessment and Plan:  Christy Galloway was seen today for an episodic visit.  Diagnoses and all order for this visit:  1. Obesity (BMI 30.0-34.9) Check with insurance for weight loss management coverage Discussed appropriate BMI Diet modification. Physical activity. Encouraged/praised to build confidence.  2. Medication management All medications discussed and reviewed in full. All questions and concerns regarding medications addressed.    Notify office for further evaluation and treatment, questions or concerns if s/s fail to improve. The risks and benefits of my recommendations, as well as other treatment options were discussed with the patient today. Questions were answered.  Further disposition pending results of labs. Discussed med's effects and SE's.    Over 15 minutes of exam, counseling, chart review, and critical decision making was performed.   Future Appointments  Date Time Provider Department Center  08/05/2023 10:00 AM Christy Galloway, Christy Patten, NP [facility] None    ------------------------------------------------------------------------------------------------------------------   HPI BP 118/70   Pulse 84   Temp (!) 97.5 F (36.4 C)   Ht 5\' 6"  (1.676 m)   Wt 222 lb 12.8 oz (101.1 kg)   SpO2 98%   BMI 35.96 kg/m   27 y.o.female presents for evaluation of weight loss management.  BMI is Body mass index is 35.96 kg/m., she has been working on diet and exercise however unable to achieve appropriate BMI despite maximal efforts to lifestyle modifications.  She is unable to take Phentermine due to currently taking Adderall for tmt of ADHD.    Wt Readings from Last 3 Encounters:  03/03/23 222 lb 12.8 oz (101.1 kg)  01/29/23 226 lb 3.2 oz (102.6 kg)  07/02/22 216 lb 12.8 oz (98.3 kg)   Past Medical History:  Diagnosis Date   Abnormal Pap smear of cervix    HPV   STD (sexually transmitted disease)    HPV     Allergies  Allergen Reactions   Amoxicillin Rash     Current Outpatient Medications on File Prior to Visit  Medication Sig   amphetamine-dextroamphetamine (ADDERALL XR) 20 MG 24 hr capsule Take 1 capsule (20 mg total) by mouth daily.   spironolactone (ALDACTONE) 25 MG tablet Take 25 mg by mouth daily.   No current facility-administered medications on file prior to visit.    ROS: all negative except what is noted in the HPI.   Physical Exam:  BP 118/70   Pulse 84   Temp (!) 97.5 F (36.4 C)   Ht 5\' 6"  (1.676 m)   Wt 222 lb 12.8 oz (101.1 kg)   SpO2 98%   BMI 35.96 kg/m   General Appearance: NAD.  Awake, conversant and cooperative. Eyes: PERRLA, EOMs intact.  Sclera white.  Conjunctiva without erythema. Sinuses: No frontal/maxillary tenderness.  No nasal discharge. Nares patent.  ENT/Mouth: Ext aud canals clear.  Bilateral TMs w/DOL and without erythema or bulging. Hearing intact.  Posterior pharynx without swelling or exudate.  Tonsils without swelling or erythema.  Neck: Supple.  No masses, nodules or thyromegaly. Respiratory: Effort is regular with non-labored breathing. Breath sounds are equal bilaterally without rales, rhonchi, wheezing or stridor.  Cardio: RRR with no MRGs. Brisk peripheral pulses without edema.  Abdomen: Active BS in all four quadrants.  Soft and non-tender without guarding, rebound tenderness, hernias or masses. Lymphatics: Non tender without lymphadenopathy.  Musculoskeletal: Full ROM, 5/5 strength, normal ambulation.  No clubbing or cyanosis. Skin: Appropriate color for ethnicity. Warm without rashes, lesions, ecchymosis, ulcers.  Neuro: CN II-XII grossly normal. Normal muscle tone without cerebellar  symptoms and intact sensation.   Psych: AO X 3,  appropriate mood and affect, insight and judgment.     Christy Glimpse, NP 4:18 PM [facility]

## 2023-03-04 ENCOUNTER — Encounter: Payer: Self-pay | Admitting: Nurse Practitioner

## 2023-03-04 DIAGNOSIS — E669 Obesity, unspecified: Secondary | ICD-10-CM

## 2023-03-04 MED ORDER — WEGOVY 0.25 MG/0.5ML ~~LOC~~ SOAJ: 0.2500 mg | SUBCUTANEOUS | 2 refills | Status: DC

## 2023-03-04 NOTE — Patient Instructions (Signed)
Obesidad en los adultos Obesity, Adult La obesidad es un exceso de Art gallery manager. Ser obeso significa que su peso es ms alto de lo que es saludable para usted.  El IMC (ndice de masa muscular) es un nmero que indica la cantidad de grasa corporal que tiene una persona. Si usted tiene un ndice de masa corporal (IMC) de 30 o ms, esto significa que es obeso. La obesidad puede causar problemas de salud graves, como los siguientes: Accidente cerebrovascular. Arteriopata coronaria (EAC). Diabetes tipo 2. Algunos tipos de cncer. Presin arterial alta (hipertensin arterial). Colesterol alto. Clculos en la vescula biliar. La obesidad tambin puede contribuir a lo siguiente: Artrosis. Apnea del sueo. Problemas de esterilidad. Cules son las causas? Consumir todos los [employer] con altos niveles de [place], International aid/development worker y [place]. Beber gran cantidad de bebidas con azcar. Nacer con genes que pueden hacerlo ms propenso a ser obeso. Tener una afeccin mdica que causa obesidad. Tomar ciertos medicamentos. Permanecer mucho tiempo sentado (tener un estilo de vida sedentario). No dormir lo suficiente. Qu incrementa el riesgo? Tener antecedentes familiares de obesidad. Vivir en un rea con acceso limitado a las siguientes posibilidades: Parques, centros recreativos o veredas. Alimentos saludables, como se venden en tiendas de comestibles y mercados de Event organiser. Cules son los signos o sntomas? El principal signo es tener demasiada grasa corporal. Cmo se trata? El tratamiento de esta afeccin frecuentemente incluye cambiar el estilo de vida. El tratamiento puede incluir: Cambios en la dieta. Esto puede incluir crear un plan de alimentacin saludable. Realizar actividad fsica. Puede incluir una actividad que hace que el corazn lata ms rpido (ejercicio [place]) y Fish farm manager de Pensions consultant. Trabaje con su mdico para disear un programa que funcione para usted. Medicamentos  para ayudarlo a Curator. Pueden utilizarse si no puede perder una libra por semana despus de 6 semanas de alimentacin saludable y ms ejercicio. Tratar las afecciones que causan la obesidad. [place]. Las opciones pueden incluir bandas gstricas y bypass gstrico. Esto puede realizarse en las siguientes situaciones: Otros tratamientos no mejoraron su afeccin. Tiene un IMC de 40 o superior. Tiene problemas de salud potencialmente mortales relacionados con la obesidad. Siga estas indicaciones en su casa: Comida y bebida  Siga las instrucciones del mdico respecto de las comidas y las bebidas. Su mdico puede recomendarle lo siguiente: Limitar las comidas rpidas, los dulces y las colaciones procesadas. Elegir opciones con bajo contenido de [place]. Por ejemplo, Counsellor de [employer]. Consumir cinco o ms porciones de frutas o verduras por da. Comer en casa con ms frecuencia. Esto le da ms control sobre lo que come. Elegir alimentos saludables cuando coma afuera. Aprender a leer las etiquetas de los alimentos. Esto le ayudar a aprender qu cantidad de alimento hay en una porcin. Tener a mano colaciones con bajo contenido de [place]. Evitar las bebidas que contengan mucha azcar. Estas incluyen refrescos, jugo de frutas, t helado con azcar y [place] saborizada. Beba suficiente agua para mantener el pis (la [place]) de color amarillo plido. No siga las dietas de [place]. Actividad fsica Haga ejercicios con frecuencia, como se lo haya indicado el mdico. La mayora de los adultos deben hacer hasta 150 minutos de ejercicio de intensidad moderada cada semana.Pregntele al mdico lo siguiente: Los tipos de ejercicios que son seguros para usted. La frecuencia con la que [employer] ejercicios. Precaliente y elongue adecuadamente antes de hacer actividad fsica. Haga un estiramiento lento despus de la actividad (relajacin). Descanse entre los perodos de  actividad. Estilo de vida Trabaje con su mdico y con un experto en alimentacin (nutricionista) para establecer un objetivo de prdida de peso que sea adecuado para usted. Limite el tiempo que pasa frente a una pantalla. Busque formas de recompensarse que no incluyan alimentos. No beba alcohol si: El mdico le indica que no lo haga. Est embarazada, puede estar embarazada o est tratando de [place]. Si bebe alcohol: Limite la cantidad que bebe a lo siguiente: De 0 a 1 medida por da para las mujeres. De 0 a 2 medidas por da para los hombres. Sepa cunta cantidad de alcohol hay en las bebidas que toma. En los [address], una medida equivale a una botella de cerveza de 12 oz (355 ml), un vaso de vino de 5 oz (148 ml) o un vaso de una bebida alcohlica de alta graduacin de 1 oz (44 ml). Indicaciones generales Lleve un diario de su prdida de peso. Esto puede ayudarlo a Youth worker de lo siguiente: Los alimentos que come. Cunto ejercicio realiza. Use los medicamentos de venta libre y los recetados solamente como se lo haya indicado el mdico. Tome vitaminas y suplementos solamente como se lo haya indicado el mdico. Considere participar en un grupo de apoyo. Preste atencin a Radiographer, therapeutic mental, ya que la obesidad puede provocar depresin o problemas de [place]. Concurra a todas las visitas de seguimiento. Comunquese con un mdico si: No puede alcanzar su objetivo de prdida de peso despus de haber modificado su dieta y su estilo de vida durante 6 semanas. Presenta dificultades respiratorias sbitas. Resumen La obesidad es un exceso de Art gallery manager. Ser obeso significa que su peso es ms alto de lo que es saludable para usted. Trabaje con su mdico para establecer un objetivo de prdida de [place]. Haga actividad fsica con regularidad tal como le indic el mdico. Esta informacin no tiene Theme park manager el consejo del mdico. Asegrese de hacerle al  mdico cualquier pregunta que tenga. Document Revised: 04/05/2021 Document Reviewed: 04/05/2021 Elsevier Patient Education  2024 [employer].

## 2023-03-10 MED ORDER — AMPHETAMINE-DEXTROAMPHET ER 20 MG PO CP24: 20.0000 mg | ORAL_CAPSULE | Freq: Every day | ORAL | 0 refills | Status: DC

## 2023-03-23 ENCOUNTER — Encounter: Payer: Self-pay | Admitting: Nurse Practitioner

## 2023-04-10 ENCOUNTER — Encounter: Payer: Self-pay | Admitting: Nurse Practitioner

## 2023-04-10 DIAGNOSIS — E669 Obesity, unspecified: Secondary | ICD-10-CM

## 2023-04-11 MED ORDER — WEGOVY 0.25 MG/0.5ML ~~LOC~~ SOAJ: SUBCUTANEOUS | 2 refills | Status: DC

## 2023-04-28 ENCOUNTER — Other Ambulatory Visit: Payer: Self-pay | Admitting: Nurse Practitioner

## 2023-04-29 MED ORDER — AMPHETAMINE-DEXTROAMPHET ER 20 MG PO CP24: 20.0000 mg | ORAL_CAPSULE | Freq: Every day | ORAL | 0 refills | Status: DC

## 2023-05-05 ENCOUNTER — Encounter: Payer: Self-pay | Admitting: Nurse Practitioner

## 2023-05-05 DIAGNOSIS — E669 Obesity, unspecified: Secondary | ICD-10-CM

## 2023-05-07 MED ORDER — WEGOVY 0.25 MG/0.5ML ~~LOC~~ SOAJ: SUBCUTANEOUS | 2 refills | Status: DC

## 2023-05-16 ENCOUNTER — Other Ambulatory Visit: Payer: Self-pay

## 2023-05-16 MED ORDER — SEMAGLUTIDE-WEIGHT MANAGEMENT 0.5 MG/0.5ML ~~LOC~~ SOAJ: 0.5000 mg | SUBCUTANEOUS | 0 refills | Status: DC

## 2023-06-05 ENCOUNTER — Other Ambulatory Visit: Payer: Self-pay | Admitting: Nurse Practitioner

## 2023-06-05 ENCOUNTER — Other Ambulatory Visit (HOSPITAL_COMMUNITY): Payer: Self-pay

## 2023-06-05 MED ORDER — SEMAGLUTIDE-WEIGHT MANAGEMENT 0.5 MG/0.5ML ~~LOC~~ SOAJ
0.5000 mg | SUBCUTANEOUS | 0 refills | Status: DC
  Filled 2023-06-05: qty 2, 28d supply, fill #0

## 2023-06-14 ENCOUNTER — Other Ambulatory Visit: Payer: Self-pay | Admitting: Nurse Practitioner

## 2023-06-16 MED ORDER — AMPHETAMINE-DEXTROAMPHET ER 20 MG PO CP24: 20.0000 mg | ORAL_CAPSULE | Freq: Every day | ORAL | 0 refills | Status: DC

## 2023-07-25 ENCOUNTER — Other Ambulatory Visit: Payer: Self-pay | Admitting: Nurse Practitioner

## 2023-07-25 ENCOUNTER — Other Ambulatory Visit: Payer: Self-pay | Admitting: Internal Medicine

## 2023-07-25 MED ORDER — AMPHETAMINE-DEXTROAMPHET ER 20 MG PO CP24: 20.0000 mg | ORAL_CAPSULE | Freq: Every day | ORAL | 0 refills | Status: DC

## 2023-07-31 ENCOUNTER — Ambulatory Visit: Payer: 59 | Admitting: Nurse Practitioner

## 2023-08-04 ENCOUNTER — Other Ambulatory Visit: Payer: Self-pay | Admitting: Nurse Practitioner

## 2023-08-04 MED ORDER — SEMAGLUTIDE-WEIGHT MANAGEMENT 0.5 MG/0.5ML ~~LOC~~ SOAJ: 0.5000 mg | SUBCUTANEOUS | 0 refills | Status: DC

## 2023-08-04 MED ORDER — AMPHETAMINE-DEXTROAMPHET ER 20 MG PO CP24: 20.0000 mg | ORAL_CAPSULE | Freq: Every day | ORAL | 0 refills | Status: DC

## 2023-08-05 ENCOUNTER — Encounter: Payer: 59 | Admitting: Nurse Practitioner

## 2023-08-07 ENCOUNTER — Encounter: Payer: Self-pay | Admitting: Nurse Practitioner

## 2023-08-07 ENCOUNTER — Telehealth: Payer: Self-pay

## 2023-08-07 ENCOUNTER — Ambulatory Visit (INDEPENDENT_AMBULATORY_CARE_PROVIDER_SITE_OTHER): Payer: 59 | Admitting: Nurse Practitioner

## 2023-08-07 VITALS — BP 108/70 | HR 83 | Temp 98.1°F | Ht 66.0 in | Wt 219.2 lb

## 2023-08-07 DIAGNOSIS — E559 Vitamin D deficiency, unspecified: Secondary | ICD-10-CM

## 2023-08-07 DIAGNOSIS — Z1329 Encounter for screening for other suspected endocrine disorder: Secondary | ICD-10-CM

## 2023-08-07 DIAGNOSIS — Z79899 Other long term (current) drug therapy: Secondary | ICD-10-CM

## 2023-08-07 DIAGNOSIS — Z13220 Encounter for screening for lipoid disorders: Secondary | ICD-10-CM

## 2023-08-07 DIAGNOSIS — F900 Attention-deficit hyperactivity disorder, predominantly inattentive type: Secondary | ICD-10-CM

## 2023-08-07 DIAGNOSIS — E66811 Obesity, class 1: Secondary | ICD-10-CM

## 2023-08-07 DIAGNOSIS — Z0001 Encounter for general adult medical examination with abnormal findings: Secondary | ICD-10-CM

## 2023-08-07 DIAGNOSIS — Z131 Encounter for screening for diabetes mellitus: Secondary | ICD-10-CM

## 2023-08-07 DIAGNOSIS — Z0000 Encounter for general adult medical examination without abnormal findings: Secondary | ICD-10-CM | POA: Diagnosis not present

## 2023-08-07 NOTE — Progress Notes (Signed)
Complete Physical  Assessment and Plan:  Encounter for general adult medical examination with abnormal findings (Primary) Due annually  Health maintenance reviewed Healthily lifestyle goals set  - CBC with Differential/Platelet - COMPLETE METABOLIC PANEL WITH GFR - Lipid panel - TSH - Hemoglobin A1c - Insulin, random - VITAMIN D 25 Hydroxy (Vit-D Deficiency, Fractures)  Attention deficit hyperactivity disorder (ADHD), predominantly inattentive type Medications providing over 30% improvement of attention deficit hyperactivity disorder. No change in management. Using lowest effective dose. Discussed alternative pharmacological and non-pharmacological therapies. No suspected aberrant drug-taking behaviors. Continue to monitor  Obesity (BMI 30.0-34.9) Continue Wegovy Discussed appropriate BMI Diet modification. Physical activity. Encouraged/praised to build confidence.  - COMPLETE METABOLIC PANEL WITH GFR - Lipid panel  Screening for cholesterol level - Lipid panel  Screening for thyroid disorder - TSH  Vitamin D deficiency Continue supplement for goal of 60-100 Monitor Vitamin D levels  - VITAMIN D 25 Hydroxy (Vit-D Deficiency, Fractures)  Medication management All medications discussed and reviewed in full. All questions and concerns regarding medications addressed.    - CBC with Differential/Platelet - COMPLETE METABOLIC PANEL WITH GFR - Lipid panel - TSH - Hemoglobin A1c - Insulin, random - VITAMIN D 25 Hydroxy (Vit-D Deficiency, Fractures)  Screening for diabetes mellitus  - Hemoglobin A1c - Insulin, random   Notify office for further evaluation and treatment, questions or concerns if any reported s/s fail to improve.   The patient was advised to call back or seek an in-person evaluation if any symptoms worsen or if the condition fails to improve as anticipated.   Further disposition pending results of labs. Discussed med's effects and SE's.     I discussed the assessment and treatment plan with the patient. The patient was provided an opportunity to ask questions and all were answered. The patient agreed with the plan and demonstrated an understanding of the instructions.  Discussed med's effects and SE's. Screening labs and tests as requested with regular follow-up as recommended.  I provided 30 minutes of face-to-face time during this encounter including counseling, chart review, and critical decision making was preformed.  Today's Plan of Care is based on a patient-centered health care approach known as shared decision making - the decisions, tests and treatments allow for patient preferences and values to be balanced with clinical evidence.    Future Appointments  Date Time Provider Department Center  08/06/2024 10:00 AM Adela Glimpse, NP [facility] None    HPI  27 y.o. female  presents for a complete physical. She has Attention deficit hyperactivity disorder (ADHD) and Obesity (BMI 30.0-34.9) on their problem list.  Overall she reports feeling well.  She recently got married 06/2022 and celebrated a honeymoon in [place].   She resides in [place] and continues to have her healthcare here. Family local.  She is not interested in switching providers.     She continues treatment of ADHD with Adderall.  Patient had c/o symptoms of inattention, impulsivity, and restlessness, resulting in functional impairment.  Reports symptoms since being younger, more noticeable but also notes these symptoms as an adolescent/child.  No attention to details, difficulty sustaining attention, does not follow instructions, forgetful.  Often fidgets, unable to engage in leisure activities quietly, talks excessively, interrupts conversations.  Current Adderall dose is effective at helping to control these symptoms.  She is followed by [facility] of [place].  She has a Pap schedule for 08/18/23.  She has an IUD and reports not menstruation.    BMI  is  Body mass index is 35.38 kg/m., she has been working on diet and exercise.  Successful weight loss with ZOXWRU.  Denies any current SE. Wt Readings from Last 3 Encounters:  08/07/23 219 lb 3.2 oz (99.4 kg)  03/03/23 222 lb 12.8 oz (101.1 kg)  01/29/23 226 lb 3.2 oz (102.6 kg)   Her blood pressure has been controlled at home, today their BP is BP: 108/70 She does workout. She denies chest pain, shortness of breath, dizziness.   She is not on cholesterol medication and denies myalgias. Her cholesterol is at goal. The cholesterol last visit was:   Lab Results  Component Value Date   CHOL 158 07/02/2022   HDL 47 (L) 07/02/2022   LDLCALC 94 07/02/2022   TRIG 84 07/02/2022   CHOLHDL 3.4 07/02/2022   She has been working on diet and exercise for Prediabetes, she is not on bASA, she is not on ACE/ARB and denies polydipsia and polyuria. Last A1C in the office was: No results found for: "HGBA1C" Last GFR: Lab Results  Component Value Date   EGFR 94 01/29/2023   Patient is on Vitamin D supplement.   No results found for: "VD25OH"    Current Medications:  Current Outpatient Medications on File Prior to Visit  Medication Sig Dispense Refill   amphetamine-dextroamphetamine (ADDERALL XR) 20 MG 24 hr capsule Take 1 capsule (20 mg total) by mouth daily. 30 capsule 0   Semaglutide-Weight Management 0.5 MG/0.5ML SOAJ Inject 0.5 mg into the skin once a week. 2 mL 0   spironolactone (ALDACTONE) 25 MG tablet Take 25 mg by mouth daily.     No current facility-administered medications on file prior to visit.   Allergies:  Allergies  Allergen Reactions   Amoxicillin Rash   Medical History:  She has Attention deficit hyperactivity disorder (ADHD) and Obesity (BMI 30.0-34.9) on their problem list.  Health Maintenance:    There is no immunization history on file for this patient. Health Maintenance  Topic Date Due   COVID-19 Vaccine (1) Never done   HIV Screening  Never done   Hepatitis C  Screening  Never done   DTaP/Tdap/Td (1 - Tdap) Never done   INFLUENZA VACCINE  Never done   Cervical Cancer Screening (Pap smear)  10/03/2023   HPV VACCINES  Aged Out   LMP: No LMP recorded. (Menstrual status: IUD). No period. Sexually Active: yes STD testing offered - declines  Pap:  UTD - has GYN visit scheduled 08/18/23 MGM: Due age 64 DEXA: N/A  Colonoscopy: N/A EGD: N/A  Last Dental Exam:  Established; Followed every 6 months  Last Eye Exam: No concerns Last Derm Exam: No concerns  Patient Care Team: Lucky Cowboy, MD as PCP - General (Internal Medicine)  Surgical History:  She has a past surgical history that includes Intrauterine device insertion. Family History:  Herfamily history includes Diabetes in her father; Hypertension in her father. Social History:  She reports that she has been smoking e-cigarettes. She has never used smokeless tobacco. She reports that she does not currently use alcohol. She reports that she does not use drugs.  Review of Systems: Review of Systems  Constitutional: Negative.   HENT: Negative.    Eyes: Negative.   Respiratory: Negative.    Cardiovascular: Negative.   Gastrointestinal: Negative.   Genitourinary: Negative.   Musculoskeletal: Negative.   Skin: Negative.   Neurological:        Adhd  Endo/Heme/Allergies: Negative.   Psychiatric/Behavioral: Negative.  Physical Exam: Estimated body mass index is 35.38 kg/m as calculated from the following:   Height as of this encounter: 5\' 6"  (1.676 m).   Weight as of this encounter: 219 lb 3.2 oz (99.4 kg). BP 108/70   Pulse 83   Temp 98.1 F (36.7 C)   Ht 5\' 6"  (1.676 m)   Wt 219 lb 3.2 oz (99.4 kg)   SpO2 98%   BMI 35.38 kg/m   General Appearance: Well nourished, well developed, in no apparent distress.  Eyes: PERRLA, EOMs, conjunctiva no swelling or erythema, normal fundi and vessels.  Sinuses: No Frontal/maxillary tenderness  ENT/Mouth: Ext aud canals clear,  normal light reflex with TMs without erythema, bulging. Good dentition. No erythema, swelling, or exudate on post pharynx. Tonsils not swollen or erythematous. Hearing normal.  Neck: Supple, thyroid normal. No bruits  Respiratory: Respiratory effort normal, BS equal bilaterally without rales, rhonchi, wheezing or stridor.  Cardio: RRR without murmurs, rubs or gallops. Brisk peripheral pulses without edema.  Chest: symmetric, with normal excursions and percussion.  Breasts: Symmetric, without lumps, nipple discharge, retractions.  Abdomen: Soft, nontender, no guarding, rebound, hernias, masses, or organomegaly.  Lymphatics: Non tender without lymphadenopathy.  Musculoskeletal: Full ROM all peripheral extremities,5/5 strength, and normal gait.  Skin: Warm, dry without rashes, lesions, ecchymosis. Neuro: Cranial nerves intact, reflexes equal bilaterally. Normal muscle tone, no cerebellar symptoms. Sensation intact.  Psych: Awake and oriented X 3, normal affect, Insight and Judgment appropriate.  Genitourinary: Female genitalia: not done  Adela Glimpse, NP 12:30 PM [facility]

## 2023-08-07 NOTE — Telephone Encounter (Signed)
Adderall prior auth approved through 08/06/24

## 2023-08-07 NOTE — Patient Instructions (Signed)

## 2023-08-08 ENCOUNTER — Telehealth: Payer: Self-pay

## 2023-08-08 LAB — COMPLETE METABOLIC PANEL WITH GFR
AG Ratio: 2 (calc) (ref 1.0–2.5)
ALT: 33 U/L — ABNORMAL HIGH (ref 6–29)
AST: 24 U/L (ref 10–30)
Albumin: 4.9 g/dL (ref 3.6–5.1)
Alkaline phosphatase (APISO): 49 U/L (ref 31–125)
BUN: 9 mg/dL (ref 7–25)
CO2: 26 mmol/L (ref 20–32)
Calcium: 9.5 mg/dL (ref 8.6–10.2)
Chloride: 101 mmol/L (ref 98–110)
Creat: 0.87 mg/dL (ref 0.50–0.96)
Globulin: 2.5 g/dL (ref 1.9–3.7)
Glucose, Bld: 89 mg/dL (ref 65–99)
Potassium: 4.3 mmol/L (ref 3.5–5.3)
Sodium: 137 mmol/L (ref 135–146)
Total Bilirubin: 0.7 mg/dL (ref 0.2–1.2)
Total Protein: 7.4 g/dL (ref 6.1–8.1)
eGFR: 94 mL/min/{1.73_m2} (ref >=60)

## 2023-08-08 LAB — LIPID PANEL
Cholesterol: 171 mg/dL (ref <200)
HDL: 45 mg/dL — ABNORMAL LOW (ref >=50)
LDL Cholesterol (Calc): 102 mg/dL — ABNORMAL HIGH
Non-HDL Cholesterol (Calc): 126 mg/dL (ref <130)
Total CHOL/HDL Ratio: 3.8 (calc) (ref <5.0)
Triglycerides: 138 mg/dL (ref <150)

## 2023-08-08 LAB — HEMOGLOBIN A1C
Hgb A1c MFr Bld: 5.8 %{Hb} — ABNORMAL HIGH (ref <5.7)
Mean Plasma Glucose: 120 mg/dL
eAG (mmol/L): 6.6 mmol/L

## 2023-08-08 LAB — CBC WITH DIFFERENTIAL/PLATELET
Absolute Lymphocytes: 1822 {cells}/uL (ref 850–3900)
Absolute Monocytes: 646 {cells}/uL (ref 200–950)
Basophils Absolute: 27 {cells}/uL (ref 0–200)
Basophils Relative: 0.4 %
Eosinophils Absolute: 102 {cells}/uL (ref 15–500)
Eosinophils Relative: 1.5 %
HCT: 43.8 % (ref 35.0–45.0)
Hemoglobin: 14.6 g/dL (ref 11.7–15.5)
MCH: 29 pg (ref 27.0–33.0)
MCHC: 33.3 g/dL (ref 32.0–36.0)
MCV: 87.1 fL (ref 80.0–100.0)
MPV: 10.4 fL (ref 7.5–12.5)
Monocytes Relative: 9.5 %
Neutro Abs: 4202 {cells}/uL (ref 1500–7800)
Neutrophils Relative %: 61.8 %
Platelets: 284 10*3/uL (ref 140–400)
RBC: 5.03 10*6/uL (ref 3.80–5.10)
RDW: 12.1 % (ref 11.0–15.0)
Total Lymphocyte: 26.8 %
WBC: 6.8 10*3/uL (ref 3.8–10.8)

## 2023-08-08 LAB — TSH: TSH: 0.93 m[IU]/L

## 2023-08-08 LAB — INSULIN, RANDOM: Insulin: 16 u[IU]/mL

## 2023-08-08 LAB — VITAMIN D 25 HYDROXY (VIT D DEFICIENCY, FRACTURES): Vit D, 25-Hydroxy: 16 ng/mL — ABNORMAL LOW (ref 30–100)

## 2023-08-08 NOTE — Telephone Encounter (Signed)
PA for [facility] completed and approved

## 2023-08-22 ENCOUNTER — Encounter: Payer: Self-pay | Admitting: Nurse Practitioner

## 2023-08-22 MED ORDER — WEGOVY 0.25 MG/0.5ML ~~LOC~~ SOAJ: 0.2500 mg | SUBCUTANEOUS | 2 refills | Status: AC

## 2023-09-17 ENCOUNTER — Other Ambulatory Visit: Payer: Self-pay | Admitting: Nurse Practitioner

## 2023-09-17 MED ORDER — AMPHETAMINE-DEXTROAMPHET ER 20 MG PO CP24: 20.0000 mg | ORAL_CAPSULE | Freq: Every day | ORAL | 0 refills | Status: AC

## 2023-10-20 ENCOUNTER — Encounter: Payer: Self-pay | Admitting: Internal Medicine

## 2024-02-05 ENCOUNTER — Ambulatory Visit: Payer: 59 | Admitting: Nurse Practitioner

## 2024-08-04 ENCOUNTER — Encounter: Payer: 59 | Admitting: Nurse Practitioner

## 2024-08-06 ENCOUNTER — Encounter: Payer: 59 | Admitting: Nurse Practitioner
